# Patient Record
Sex: Female | Born: 1990 | Race: Black or African American | Hispanic: No | Marital: Single | Smoking: Current every day smoker
Health system: Southern US, Community
[De-identification: ages and names within clinical notes are randomized; demographics above are authoritative.]

## PROBLEM LIST (undated history)

## (undated) ENCOUNTER — Inpatient Hospital Stay (HOSPITAL_COMMUNITY): Payer: Self-pay

## (undated) ENCOUNTER — Emergency Department (HOSPITAL_COMMUNITY): Admission: EM | Payer: Medicaid Other | Source: Home / Self Care

## (undated) DIAGNOSIS — Z9289 Personal history of other medical treatment: Secondary | ICD-10-CM

## (undated) DIAGNOSIS — T148XXA Other injury of unspecified body region, initial encounter: Secondary | ICD-10-CM

## (undated) DIAGNOSIS — A549 Gonococcal infection, unspecified: Secondary | ICD-10-CM

## (undated) DIAGNOSIS — O1410 Severe pre-eclampsia, unspecified trimester: Secondary | ICD-10-CM

## (undated) DIAGNOSIS — B999 Unspecified infectious disease: Secondary | ICD-10-CM

## (undated) DIAGNOSIS — Z789 Other specified health status: Secondary | ICD-10-CM

## (undated) DIAGNOSIS — A749 Chlamydial infection, unspecified: Secondary | ICD-10-CM

## (undated) HISTORY — PX: NO PAST SURGERIES: SHX2092

## (undated) HISTORY — DX: Unspecified infectious disease: B99.9

---
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
## 2012-01-12 ENCOUNTER — Ambulatory Visit (INDEPENDENT_AMBULATORY_CARE_PROVIDER_SITE_OTHER): Payer: Medicaid Other

## 2012-01-12 DIAGNOSIS — Z36 Encounter for antenatal screening of mother: Secondary | ICD-10-CM

## 2012-01-12 LAB — US OB COMP LESS 14 WKS

## 2012-01-25 ENCOUNTER — Encounter: Payer: Medicaid Other | Admitting: Obstetrics and Gynecology

## 2012-01-25 ENCOUNTER — Telehealth: Payer: Self-pay | Admitting: Obstetrics and Gynecology

## 2012-01-25 NOTE — Telephone Encounter (Signed)
Tc to pt regarding msg.  Pt informed results typically take about 2 wks to come back.  We do not have them all yet.  Pt will discuss @ her appt on 01/30/12, pt voices agreement.

## 2012-01-30 ENCOUNTER — Ambulatory Visit (INDEPENDENT_AMBULATORY_CARE_PROVIDER_SITE_OTHER): Payer: Medicaid Other | Admitting: Obstetrics and Gynecology

## 2012-01-30 VITALS — BP 100/60 | Wt 121.0 lb

## 2012-01-30 DIAGNOSIS — Z331 Pregnant state, incidental: Secondary | ICD-10-CM

## 2012-01-30 DIAGNOSIS — Z3490 Encounter for supervision of normal pregnancy, unspecified, unspecified trimester: Secondary | ICD-10-CM

## 2012-01-30 MED ORDER — PREQUE 10 15-25-0.5-50 MG PO TABS: 2.0000 | ORAL_TABLET | Freq: Once | ORAL | Status: DC

## 2012-01-30 NOTE — Progress Notes (Signed)
[redacted]w[redacted]d AFP today 1st trim scree neg and PNL reviewed Repeat UCx Anatomy scan at [facility] Refill PNV  RTO 4wks

## 2012-02-01 LAB — ALPHA FETOPROTEIN, MATERNAL
AFP: 47.7 IU/mL
MoM for AFP: 1.32
Open Spina bifida: NEGATIVE

## 2012-02-01 LAB — CULTURE, OB URINE: Organism ID, Bacteria: NO GROWTH

## 2012-02-19 ENCOUNTER — Telehealth: Payer: Self-pay | Admitting: Obstetrics and Gynecology

## 2012-02-26 ENCOUNTER — Other Ambulatory Visit: Payer: Self-pay

## 2012-02-26 DIAGNOSIS — Z3689 Encounter for other specified antenatal screening: Secondary | ICD-10-CM

## 2012-02-27 ENCOUNTER — Ambulatory Visit (INDEPENDENT_AMBULATORY_CARE_PROVIDER_SITE_OTHER): Payer: Medicaid Other | Admitting: Obstetrics and Gynecology

## 2012-02-27 ENCOUNTER — Encounter: Payer: Self-pay | Admitting: Obstetrics and Gynecology

## 2012-02-27 ENCOUNTER — Ambulatory Visit (INDEPENDENT_AMBULATORY_CARE_PROVIDER_SITE_OTHER): Payer: Medicaid Other

## 2012-02-27 VITALS — BP 100/60 | Wt 121.0 lb

## 2012-02-27 DIAGNOSIS — IMO0001 Reserved for inherently not codable concepts without codable children: Secondary | ICD-10-CM

## 2012-02-27 DIAGNOSIS — R636 Underweight: Secondary | ICD-10-CM

## 2012-02-27 DIAGNOSIS — Z3689 Encounter for other specified antenatal screening: Secondary | ICD-10-CM

## 2012-02-27 DIAGNOSIS — A64 Unspecified sexually transmitted disease: Secondary | ICD-10-CM

## 2012-02-27 DIAGNOSIS — O358XX0 Maternal care for other (suspected) fetal abnormality and damage, not applicable or unspecified: Secondary | ICD-10-CM

## 2012-02-27 NOTE — Progress Notes (Signed)
[redacted]w[redacted]d Pt c/o some lower abdominal pain  Ultrasound shows:  Gestational age by [place] 19w 6d     SIUP  S=D     [place] EDD: 07/17/12             AF: nl            Cervical length: 3.27 cm            Placenta localization: posterior           Fetal presentation: variable      Anatomy survey is normal    Anatomy complete:  no            Gender : female Comments:Placenta  Edge to cx = 5.1 cm Normal placental cord insertion.  NOTE: 2 vessel cord. Left umbilical artery is missing. No other abnormality is seen. Anatomy not seen due to fetal position. LVOT, and lower fetal spine.normal head anatomy. CX closed. Normal ovaries and adnexa. Suggest f/u at 26/27 weeks to assess growth and anatomy.  No wt gain last visit .  Recommended at least 3 high calorie, high protein meals plus snacks Has quit smoking.  Still drinking lots of soda, advised to substitute milk and water. Next U/S at 26 wks

## 2012-02-28 LAB — US OB COMP + 14 WK

## 2012-03-20 NOTE — L&D Delivery Note (Signed)
Delivery Note  Pt began pushing at about 2230 , w frequent position changes due to pt having suprapubic pain. FHR remained reassuring, w mild variables.   At 11:33 PM a viable female was delivered via Vaginal, Spontaneous Delivery (Presentation: Right Occiput Posterior).  Shoulders delivered easily APGAR: 9, 9; weight: pending  Placenta status: Intact, Spontaneous. - to pathology  Cord: 2 vessels with the following complications: none Cord pH: n/a   Anesthesia: Epidural, local  Episiotomy: None Lacerations: 2nd degree Suture Repair: 3.0 vicryl rapide Est. Blood Loss (mL): 300  Mild persistent uterine atony, cytotec given PR w good results   Mom to postpartum.  Baby to nursery-stable. Infant remains skin-skin w mom, stable in recovery  Pt plans to BF, desires outpatient circumcision   24 hour urine collection to continue until 2pm on 07/21/12   Hannah Nelson M 07/21/2012, 12:22 AM

## 2012-03-26 ENCOUNTER — Encounter: Payer: Medicaid Other | Admitting: Obstetrics and Gynecology

## 2012-03-29 ENCOUNTER — Encounter: Payer: Self-pay | Admitting: Obstetrics and Gynecology

## 2012-03-29 ENCOUNTER — Ambulatory Visit (INDEPENDENT_AMBULATORY_CARE_PROVIDER_SITE_OTHER): Payer: Medicaid Other | Admitting: Obstetrics and Gynecology

## 2012-03-29 DIAGNOSIS — Z23 Encounter for immunization: Secondary | ICD-10-CM

## 2012-03-29 NOTE — Progress Notes (Signed)
[redacted]w[redacted]d Pt c/o HAs usuallly relieved with aleve.  Told her she can take that until 28 weeks 2VC  [place] for growth @[facility] 

## 2012-03-29 NOTE — Progress Notes (Signed)
Flu shot given today

## 2012-03-29 NOTE — Patient Instructions (Signed)
Fetal Movement Counts Patient Name: __________________________________________________ Patient Due Date: ____________________ Kick counts is highly recommended in high risk pregnancies, but it is a good idea for every pregnant woman to do. Start counting fetal movements at 28 weeks of the pregnancy. Fetal movements increase after eating a full meal or eating or drinking something sweet (the blood sugar is higher). It is also important to drink plenty of fluids (well hydrated) before doing the count. Lie on your left side because it helps with the circulation or you can sit in a comfortable chair with your arms over your belly (abdomen) with no distractions around you. DOING THE COUNT  Try to do the count the same time of day each time you do it.  Mark the day and time, then see how long it takes for you to feel 10 movements (kicks, flutters, swishes, rolls). You should have at least 10 movements within 2 hours. You will most likely feel 10 movements in much less than 2 hours. If you do not, wait an hour and count again. After a couple of days you will see a pattern.  What you are looking for is a change in the pattern or not enough counts in 2 hours. Is it taking longer in time to reach 10 movements? SEEK MEDICAL CARE IF:  You feel less than 10 counts in 2 hours. Tried twice.  No movement in one hour.  The pattern is changing or taking longer each day to reach 10 counts in 2 hours.  You feel the baby is not moving as it usually does. Date: ____________ Movements: ____________ Start time: ____________ Finish time: ____________  Date: ____________ Movements: ____________ Start time: ____________ Finish time: ____________ Date: ____________ Movements: ____________ Start time: ____________ Finish time: ____________ Date: ____________ Movements: ____________ Start time: ____________ Finish time: ____________ Date: ____________ Movements: ____________ Start time: ____________ Finish time:  ____________ Date: ____________ Movements: ____________ Start time: ____________ Finish time: ____________ Date: ____________ Movements: ____________ Start time: ____________ Finish time: ____________ Date: ____________ Movements: ____________ Start time: ____________ Finish time: ____________  Date: ____________ Movements: ____________ Start time: ____________ Finish time: ____________ Date: ____________ Movements: ____________ Start time: ____________ Finish time: ____________ Date: ____________ Movements: ____________ Start time: ____________ Finish time: ____________ Date: ____________ Movements: ____________ Start time: ____________ Finish time: ____________ Date: ____________ Movements: ____________ Start time: ____________ Finish time: ____________ Date: ____________ Movements: ____________ Start time: ____________ Finish time: ____________ Date: ____________ Movements: ____________ Start time: ____________ Finish time: ____________  Date: ____________ Movements: ____________ Start time: ____________ Finish time: ____________ Date: ____________ Movements: ____________ Start time: ____________ Finish time: ____________ Date: ____________ Movements: ____________ Start time: ____________ Finish time: ____________ Date: ____________ Movements: ____________ Start time: ____________ Finish time: ____________ Date: ____________ Movements: ____________ Start time: ____________ Finish time: ____________ Date: ____________ Movements: ____________ Start time: ____________ Finish time: ____________ Date: ____________ Movements: ____________ Start time: ____________ Finish time: ____________  Date: ____________ Movements: ____________ Start time: ____________ Finish time: ____________ Date: ____________ Movements: ____________ Start time: ____________ Finish time: ____________ Date: ____________ Movements: ____________ Start time: ____________ Finish time: ____________ Date: ____________ Movements:  ____________ Start time: ____________ Finish time: ____________ Date: ____________ Movements: ____________ Start time: ____________ Finish time: ____________ Date: ____________ Movements: ____________ Start time: ____________ Finish time: ____________ Date: ____________ Movements: ____________ Start time: ____________ Finish time: ____________  Date: ____________ Movements: ____________ Start time: ____________ Finish time: ____________ Date: ____________ Movements: ____________ Start time: ____________ Finish time: ____________ Date: ____________ Movements: ____________ Start time: ____________ Finish time: ____________ Date: ____________ Movements:   ____________ Start time: ____________ Finish time: ____________ Date: ____________ Movements: ____________ Start time: ____________ Finish time: ____________ Date: ____________ Movements: ____________ Start time: ____________ Finish time: ____________ Date: ____________ Movements: ____________ Start time: ____________ Finish time: ____________  Date: ____________ Movements: ____________ Start time: ____________ Finish time: ____________ Date: ____________ Movements: ____________ Start time: ____________ Finish time: ____________ Date: ____________ Movements: ____________ Start time: ____________ Finish time: ____________ Date: ____________ Movements: ____________ Start time: ____________ Finish time: ____________ Date: ____________ Movements: ____________ Start time: ____________ Finish time: ____________ Date: ____________ Movements: ____________ Start time: ____________ Finish time: ____________ Date: ____________ Movements: ____________ Start time: ____________ Finish time: ____________  Date: ____________ Movements: ____________ Start time: ____________ Finish time: ____________ Date: ____________ Movements: ____________ Start time: ____________ Finish time: ____________ Date: ____________ Movements: ____________ Start time: ____________ Finish  time: ____________ Date: ____________ Movements: ____________ Start time: ____________ Finish time: ____________ Date: ____________ Movements: ____________ Start time: ____________ Finish time: ____________ Date: ____________ Movements: ____________ Start time: ____________ Finish time: ____________ Date: ____________ Movements: ____________ Start time: ____________ Finish time: ____________  Date: ____________ Movements: ____________ Start time: ____________ Finish time: ____________ Date: ____________ Movements: ____________ Start time: ____________ Finish time: ____________ Date: ____________ Movements: ____________ Start time: ____________ Finish time: ____________ Date: ____________ Movements: ____________ Start time: ____________ Finish time: ____________ Date: ____________ Movements: ____________ Start time: ____________ Finish time: ____________ Date: ____________ Movements: ____________ Start time: ____________ Finish time: ____________ Document Released: 04/05/2006 Document Revised: 05/29/2011 Document Reviewed: 10/06/2008 [employer] Patient Information 2013 [employer].  

## 2012-04-26 ENCOUNTER — Other Ambulatory Visit: Payer: Self-pay | Admitting: Obstetrics and Gynecology

## 2012-04-26 ENCOUNTER — Other Ambulatory Visit: Payer: Medicaid Other

## 2012-04-26 ENCOUNTER — Ambulatory Visit: Payer: Medicaid Other

## 2012-04-26 ENCOUNTER — Ambulatory Visit: Payer: Medicaid Other | Admitting: Obstetrics and Gynecology

## 2012-04-26 ENCOUNTER — Encounter: Payer: Self-pay | Admitting: Obstetrics and Gynecology

## 2012-04-26 VITALS — BP 100/66 | Wt 146.0 lb

## 2012-04-26 DIAGNOSIS — Z331 Pregnant state, incidental: Secondary | ICD-10-CM

## 2012-04-26 LAB — US OB FOLLOW UP

## 2012-04-26 LAB — HEMOGLOBIN: Hemoglobin: 11.5 g/dL — ABNORMAL LOW (ref 12.0–15.0)

## 2012-04-26 NOTE — Progress Notes (Signed)
[redacted]w[redacted]d [place] EFW 2-9 46% vtx  AFI 14.1 cx 3.71 cm glucola today Pt with constant back pain.  Exercises reviewed

## 2012-04-26 NOTE — Patient Instructions (Signed)

## 2012-04-26 NOTE — Progress Notes (Signed)
Glucola given today. 

## 2012-05-09 ENCOUNTER — Encounter: Payer: Self-pay | Admitting: Obstetrics and Gynecology

## 2012-05-09 ENCOUNTER — Ambulatory Visit: Payer: Medicaid Other | Admitting: Obstetrics and Gynecology

## 2012-05-09 VITALS — BP 110/60 | Wt 149.0 lb

## 2012-05-09 DIAGNOSIS — Q270 Congenital absence and hypoplasia of umbilical artery: Secondary | ICD-10-CM

## 2012-05-09 NOTE — Progress Notes (Signed)
[redacted]w[redacted]d  GFM Glucola normal Ultrasound at [facility]

## 2012-05-09 NOTE — Progress Notes (Signed)
[redacted]w[redacted]d Pt has no complaints

## 2012-05-22 ENCOUNTER — Ambulatory Visit: Payer: Medicaid Other

## 2012-05-22 ENCOUNTER — Other Ambulatory Visit: Payer: Self-pay | Admitting: Obstetrics and Gynecology

## 2012-05-22 ENCOUNTER — Ambulatory Visit: Payer: Medicaid Other | Admitting: Family Medicine

## 2012-05-22 VITALS — BP 104/64 | Wt 149.0 lb

## 2012-05-22 DIAGNOSIS — Q270 Congenital absence and hypoplasia of umbilical artery: Secondary | ICD-10-CM

## 2012-05-22 DIAGNOSIS — Z331 Pregnant state, incidental: Secondary | ICD-10-CM

## 2012-05-22 LAB — US OB FOLLOW UP

## 2012-05-22 LAB — US OB TRANSVAGINAL

## 2012-05-22 NOTE — Progress Notes (Signed)
[redacted]w[redacted]d No complaints. Ultrasound shows:  SIUP  S=D     [place] EDD: 07/17/2012           AFI: 16.75 60th%tile           Cervical length:Cervix is closed            Placenta localization: posterior           Fetal presentation: Vertex           Note:2VC cord is seen, Normal [place]

## 2012-05-22 NOTE — Progress Notes (Signed)
[redacted]w[redacted]d Doing well, good FM. U/S reviewed, no questions. ROB in 2 wks. L.Carter, FNP-BC

## 2012-06-06 ENCOUNTER — Ambulatory Visit: Payer: Medicaid Other | Admitting: Obstetrics and Gynecology

## 2012-06-06 ENCOUNTER — Encounter: Payer: Self-pay | Admitting: Obstetrics and Gynecology

## 2012-06-06 NOTE — Progress Notes (Signed)
Pt c/o back pain

## 2012-06-06 NOTE — Patient Instructions (Signed)

## 2012-06-06 NOTE — Progress Notes (Signed)
[redacted]w[redacted]d A/P GBS @[facility]  Fetal kick counts reviewed Labor reviewed with pt All patients  questions answered [place] for growth bc of 2vc

## 2012-07-20 ENCOUNTER — Encounter (HOSPITAL_COMMUNITY): Payer: Self-pay | Admitting: Family

## 2012-07-20 ENCOUNTER — Inpatient Hospital Stay (HOSPITAL_COMMUNITY)
Admission: AD | Admit: 2012-07-20 | Discharge: 2012-07-24 | DRG: 774 | Disposition: A | Discharge status: Home / Self Care | Payer: Medicaid Other | Source: Ambulatory Visit | Attending: Obstetrics and Gynecology | Admitting: Obstetrics and Gynecology

## 2012-07-20 ENCOUNTER — Inpatient Hospital Stay (HOSPITAL_COMMUNITY): Payer: Medicaid Other | Admitting: Anesthesiology

## 2012-07-20 ENCOUNTER — Encounter (HOSPITAL_COMMUNITY): Payer: Self-pay | Admitting: Anesthesiology

## 2012-07-20 DIAGNOSIS — D689 Coagulation defect, unspecified: Secondary | ICD-10-CM | POA: Diagnosis not present

## 2012-07-20 DIAGNOSIS — A64 Unspecified sexually transmitted disease: Secondary | ICD-10-CM

## 2012-07-20 DIAGNOSIS — N898 Other specified noninflammatory disorders of vagina: Secondary | ICD-10-CM | POA: Diagnosis not present

## 2012-07-20 DIAGNOSIS — O1490 Unspecified pre-eclampsia, unspecified trimester: Secondary | ICD-10-CM | POA: Diagnosis present

## 2012-07-20 DIAGNOSIS — O139 Gestational [pregnancy-induced] hypertension without significant proteinuria, unspecified trimester: Secondary | ICD-10-CM | POA: Diagnosis present

## 2012-07-20 DIAGNOSIS — O358XX0 Maternal care for other (suspected) fetal abnormality and damage, not applicable or unspecified: Secondary | ICD-10-CM | POA: Diagnosis present

## 2012-07-20 DIAGNOSIS — D6489 Other specified anemias: Secondary | ICD-10-CM | POA: Diagnosis not present

## 2012-07-20 DIAGNOSIS — R636 Underweight: Secondary | ICD-10-CM

## 2012-07-20 DIAGNOSIS — O9903 Anemia complicating the puerperium: Secondary | ICD-10-CM | POA: Diagnosis not present

## 2012-07-20 DIAGNOSIS — D696 Thrombocytopenia, unspecified: Secondary | ICD-10-CM | POA: Diagnosis not present

## 2012-07-20 LAB — COMPREHENSIVE METABOLIC PANEL
ALT: 11 U/L (ref 0–35)
CO2: 21 mEq/L (ref 19–32)
Calcium: 9 mg/dL (ref 8.4–10.5)
GFR calc Af Amer: >90 mL/min (ref >90)
GFR calc non Af Amer: 81 mL/min — ABNORMAL LOW (ref >90)
Glucose, Bld: 76 mg/dL (ref 70–99)
Sodium: 136 mEq/L (ref 135–145)
Total Bilirubin: 0.3 mg/dL (ref 0.3–1.2)

## 2012-07-20 LAB — CBC
Hemoglobin: 13.5 g/dL (ref 12.0–15.0)
MCH: 29.7 pg (ref 26.0–34.0)
MCV: 85.5 fL (ref 78.0–100.0)
RBC: 4.54 MIL/uL (ref 3.87–5.11)

## 2012-07-20 LAB — URINALYSIS, ROUTINE W REFLEX MICROSCOPIC
Nitrite: NEGATIVE
Specific Gravity, Urine: 1.02 (ref 1.005–1.030)
pH: 6 (ref 5.0–8.0)

## 2012-07-20 LAB — RAPID URINE DRUG SCREEN, HOSP PERFORMED
Cocaine: NOT DETECTED
Opiates: NOT DETECTED

## 2012-07-20 LAB — URINE MICROSCOPIC-ADD ON

## 2012-07-20 MED ORDER — LACTATED RINGERS IV SOLN
INTRAVENOUS | Status: DC
  Administered 2012-07-20 (×3): via INTRAVENOUS

## 2012-07-20 MED ORDER — EPHEDRINE 5 MG/ML INJ
10.0000 mg | INTRAVENOUS | Status: DC | PRN
  Filled 2012-07-20: qty 2

## 2012-07-20 MED ORDER — LABETALOL HCL 5 MG/ML IV SOLN
10.0000 mg | INTRAVENOUS | Status: DC | PRN
  Filled 2012-07-20: qty 4

## 2012-07-20 MED ORDER — OXYTOCIN 40 UNITS IN LACTATED RINGERS INFUSION - SIMPLE MED
1.0000 m[IU]/min | INTRAVENOUS | Status: DC
  Administered 2012-07-20: 1 m[IU]/min via INTRAVENOUS
  Administered 2012-07-20: 4 m[IU]/min via INTRAVENOUS
  Filled 2012-07-20: qty 1000

## 2012-07-20 MED ORDER — CITRIC ACID-SODIUM CITRATE 334-500 MG/5ML PO SOLN: 30.0000 mL | ORAL | Status: DC | PRN

## 2012-07-20 MED ORDER — OXYTOCIN 40 UNITS IN LACTATED RINGERS INFUSION - SIMPLE MED: 62.5000 mL/h | INTRAVENOUS | Status: DC

## 2012-07-20 MED ORDER — OXYTOCIN BOLUS FROM INFUSION
500.0000 mL | INTRAVENOUS | Status: DC
  Administered 2012-07-20: 500 mL via INTRAVENOUS

## 2012-07-20 MED ORDER — OXYCODONE-ACETAMINOPHEN 5-325 MG PO TABS: 1.0000 | ORAL_TABLET | ORAL | Status: DC | PRN

## 2012-07-20 MED ORDER — SODIUM BICARBONATE 8.4 % IV SOLN
INTRAVENOUS | Status: DC | PRN
  Administered 2012-07-20: 5 mL via EPIDURAL

## 2012-07-20 MED ORDER — LIDOCAINE-EPINEPHRINE (PF) 2 %-1:200000 IJ SOLN
INTRAMUSCULAR | Status: AC
  Filled 2012-07-20: qty 20

## 2012-07-20 MED ORDER — LACTATED RINGERS IV SOLN: 500.0000 mL | INTRAVENOUS | Status: DC | PRN

## 2012-07-20 MED ORDER — LACTATED RINGERS IV SOLN
500.0000 mL | Freq: Once | INTRAVENOUS | Status: AC
  Administered 2012-07-20: 16:00:00 via INTRAVENOUS

## 2012-07-20 MED ORDER — LIDOCAINE HCL (PF) 2 % IJ SOLN
INTRAMUSCULAR | Status: DC | PRN
  Administered 2012-07-20 (×2): 5 mg

## 2012-07-20 MED ORDER — ACETAMINOPHEN 325 MG PO TABS: 650.0000 mg | ORAL_TABLET | ORAL | Status: DC | PRN

## 2012-07-20 MED ORDER — PHENYLEPHRINE 40 MCG/ML (10ML) SYRINGE FOR IV PUSH (FOR BLOOD PRESSURE SUPPORT)
80.0000 ug | PREFILLED_SYRINGE | INTRAVENOUS | Status: DC | PRN
  Filled 2012-07-20: qty 2

## 2012-07-20 MED ORDER — ZOLPIDEM TARTRATE 5 MG PO TABS: 5.0000 mg | ORAL_TABLET | Freq: Every evening | ORAL | Status: DC | PRN

## 2012-07-20 MED ORDER — BUTORPHANOL TARTRATE 1 MG/ML IJ SOLN: 1.0000 mg | INTRAMUSCULAR | Status: DC | PRN

## 2012-07-20 MED ORDER — MISOPROSTOL 200 MCG PO TABS
ORAL_TABLET | ORAL | Status: AC
  Administered 2012-07-20: 1000 ug
  Filled 2012-07-20: qty 5

## 2012-07-20 MED ORDER — SODIUM BICARBONATE 8.4 % IV SOLN
INTRAVENOUS | Status: AC
  Filled 2012-07-20: qty 50

## 2012-07-20 MED ORDER — FLEET ENEMA 7-19 GM/118ML RE ENEM: 1.0000 | ENEMA | RECTAL | Status: DC | PRN

## 2012-07-20 MED ORDER — FENTANYL 2.5 MCG/ML BUPIVACAINE 1/10 % EPIDURAL INFUSION (WH - ANES)
14.0000 mL/h | INTRAMUSCULAR | Status: DC | PRN
  Administered 2012-07-20: 14 mL/h via EPIDURAL
  Administered 2012-07-20: 17 mL/h via EPIDURAL
  Filled 2012-07-20 (×2): qty 125

## 2012-07-20 MED ORDER — FENTANYL 2.5 MCG/ML BUPIVACAINE 1/10 % EPIDURAL INFUSION (WH - ANES)
INTRAMUSCULAR | Status: DC | PRN
  Administered 2012-07-20: 14 mL/h via EPIDURAL

## 2012-07-20 MED ORDER — IBUPROFEN 600 MG PO TABS: 600.0000 mg | ORAL_TABLET | Freq: Four times a day (QID) | ORAL | Status: DC | PRN

## 2012-07-20 MED ORDER — PHENYLEPHRINE 40 MCG/ML (10ML) SYRINGE FOR IV PUSH (FOR BLOOD PRESSURE SUPPORT)
80.0000 ug | PREFILLED_SYRINGE | INTRAVENOUS | Status: DC | PRN
  Filled 2012-07-20: qty 2
  Filled 2012-07-20 (×2): qty 5

## 2012-07-20 MED ORDER — EPHEDRINE 5 MG/ML INJ
10.0000 mg | INTRAVENOUS | Status: DC | PRN
  Filled 2012-07-20: qty 2
  Filled 2012-07-20 (×2): qty 4

## 2012-07-20 MED ORDER — LIDOCAINE HCL (PF) 1 % IJ SOLN
30.0000 mL | INTRAMUSCULAR | Status: AC | PRN
  Administered 2012-07-20: 30 mL via SUBCUTANEOUS
  Filled 2012-07-20 (×2): qty 30

## 2012-07-20 MED ORDER — LIDOCAINE HCL (PF) 1 % IJ SOLN
INTRAMUSCULAR | Status: DC | PRN
  Administered 2012-07-20 (×2): 8 mL

## 2012-07-20 MED ORDER — DIPHENHYDRAMINE HCL 50 MG/ML IJ SOLN: 12.5000 mg | INTRAMUSCULAR | Status: DC | PRN

## 2012-07-20 MED ORDER — ONDANSETRON HCL 4 MG/2ML IJ SOLN
4.0000 mg | Freq: Four times a day (QID) | INTRAMUSCULAR | Status: DC | PRN
  Administered 2012-07-20: 4 mg via INTRAVENOUS
  Filled 2012-07-20: qty 2

## 2012-07-20 NOTE — MAU Note (Signed)
Patient presents to MAU with c/o contractions every 5 minutes for last hour; reports + fetal movement. Questionable ROM. Denies frank vaginal bleeding, only bloody show.  Reports baby has 2 vessel cord.

## 2012-07-20 NOTE — Progress Notes (Signed)
Patient ID: Hannah Nelson, female   DOB: Oct 23, 1990, 22 y.o.   MRN: [id number] Hannah Nelson is a 22 y.o. G1P0 at 103w3d admitted in early labor  Subjective: Epidural working a little better, starts to wear off on non-dependent side, has been rebolused and using PCA, pt declines further intervention right now. C/o nausea, no HA, or RUQ pain   Objective: BP 147/92  Pulse 70  Temp(Src) 98 F (36.7 C) (Oral)  Resp 20  Ht 5\' 4"  (1.626 m)  Wt 163 lb (73.936 kg)  BMI 27.97 kg/m2  SpO2 100%  LMP 10/11/2011  Filed Vitals:   07/20/12 1837 07/20/12 1902 07/20/12 1905 07/20/12 1927  BP: 147/88 147/90 147/92   Pulse: 93 83 70   Temp:  98 F (36.7 C)    TempSrc:      Resp: 20 18  20   Height:      Weight:      SpO2:           FHT:  FHR: 130 bpm, variability: moderate,  accelerations:  Present,  decelerations:  Absent, periods of decreased variability, +scalp stim  UC:   regular, every 2-4 minutes SVE:   6/100/-1 FSE replaced, previous one had not been tracing    Assessment / Plan: cervical change after AROM, MVU's inadequate   Labor: will begin pitocin aug Preeclampsia:  no signs or symptoms of toxicity 24hr urine in progress, labs normal, BP stable  Fetal Wellbeing:  Category I Pain Control:  Epidural Anticipated MOD:  NSVD  Recheck after 2hrs adequate ctx   Update physician PRN   Malissa Hippo 07/20/2012, 7:54 PM

## 2012-07-20 NOTE — Progress Notes (Signed)
Subjective: More comfortable with epidural, but still aware of contractions, particularly on right side.  RN has hit PCA twice.  Objective: BP 130/84  Pulse 76  Temp(Src) 98.3 F (36.8 C) (Oral)  Resp 20  Ht 5\' 4"  (1.626 m)  Wt 163 lb (73.936 kg)  BMI 27.97 kg/m2  SpO2 100%  LMP 10/11/2011  Filed Vitals:   07/20/12 1401 07/20/12 1402 07/20/12 1432 07/20/12 1502  BP: 150/93 139/86 147/92 130/84  Pulse: 77 84 79 76  Temp:   98.3 F (36.8 C)   TempSrc:   Oral   Resp: 20 20 18 20   Height:      Weight:      SpO2:       Results for orders placed during the hospital encounter of 07/20/12 (from the past 24 hour(s))  URINALYSIS, ROUTINE W REFLEX MICROSCOPIC     Status: Abnormal   Collection Time    07/20/12 10:43 AM      Result Value Range   Color, Urine YELLOW  YELLOW   APPearance CLEAR  CLEAR   Specific Gravity, Urine 1.020  1.005 - 1.030   pH 6.0  5.0 - 8.0   Glucose, UA NEGATIVE  NEGATIVE mg/dL   Hgb urine dipstick NEGATIVE  NEGATIVE   Bilirubin Urine NEGATIVE  NEGATIVE   Ketones, ur NEGATIVE  NEGATIVE mg/dL   Protein, ur NEGATIVE  NEGATIVE mg/dL   Urobilinogen, UA 0.2  0.0 - 1.0 mg/dL   Nitrite NEGATIVE  NEGATIVE   Leukocytes, UA TRACE (*) NEGATIVE  URINE RAPID DRUG SCREEN (HOSP PERFORMED)     Status: None   Collection Time    07/20/12 10:43 AM      Result Value Range   Opiates NONE DETECTED  NONE DETECTED   Cocaine NONE DETECTED  NONE DETECTED   Benzodiazepines NONE DETECTED  NONE DETECTED   Amphetamines NONE DETECTED  NONE DETECTED   Tetrahydrocannabinol NONE DETECTED  NONE DETECTED   Barbiturates NONE DETECTED  NONE DETECTED  URINE MICROSCOPIC-ADD ON     Status: Abnormal   Collection Time    07/20/12 10:43 AM      Result Value Range   Squamous Epithelial / LPF FEW (*) RARE   WBC, UA 3-6  <3 WBC/hpf   RBC / HPF 0-2  <3 RBC/hpf   Bacteria, UA FEW (*) RARE  CBC     Status: Abnormal   Collection Time    07/20/12 11:30 AM      Result Value Range   WBC  15.8 (*) 4.0 - 10.5 K/uL   RBC 4.54  3.87 - 5.11 MIL/uL   Hemoglobin 13.5  12.0 - 15.0 g/dL   HCT 40.9  81.1 - 91.4 %   MCV 85.5  78.0 - 100.0 fL   MCH 29.7  26.0 - 34.0 pg   MCHC 34.8  30.0 - 36.0 g/dL   RDW 78.2  95.6 - 21.3 %   Platelets 188  150 - 400 K/uL  COMPREHENSIVE METABOLIC PANEL     Status: Abnormal   Collection Time    07/20/12 11:30 AM      Result Value Range   Sodium 136  135 - 145 mEq/L   Potassium 3.7  3.5 - 5.1 mEq/L   Chloride 105  96 - 112 mEq/L   CO2 21  19 - 32 mEq/L   Glucose, Bld 76  70 - 99 mg/dL   BUN 9  6 - 23 mg/dL   Creatinine, Ser 0.86  0.50 - 1.10 mg/dL   Calcium 9.0  8.4 - 16.1 mg/dL   Total Protein 5.3 (*) 6.0 - 8.3 g/dL   Albumin 2.4 (*) 3.5 - 5.2 g/dL   AST 20  0 - 37 U/L   ALT 11  0 - 35 U/L   Alkaline Phosphatase 217 (*) 39 - 117 U/L   Total Bilirubin 0.3  0.3 - 1.2 mg/dL   GFR calc non Af Amer 81 (*) >90 mL/min   GFR calc Af Amer >90  >90 mL/min  LACTATE DEHYDROGENASE     Status: None   Collection Time    07/20/12 11:30 AM      Result Value Range   LDH 229  94 - 250 U/L  URIC ACID     Status: None   Collection Time    07/20/12 11:30 AM      Result Value Range   Uric Acid, Serum 6.4  2.4 - 7.0 mg/dL   24 hour urine collection begun with insertion of foley at 2pm.   Total I/O In: 700 [I.V.:700] Out: 150 [Urine:150]  FHT: Reassuring, with moderate variability since IV hydration--no decels, segments of spontaneous CST. UC:   irregular, every 3-7 minutes, mild/moderate quality  SVE:  2-3, 90%, vtx, -1 BBOW AROM--clear fluid IUPC and FSE applied.  Assessment / Plan: Early labor Gestational hypertension vs pre-eclampsia 2vc Improved FHR tracing  Plan: Observe labor status after AROM--augment prn. Continue to monitor BP--Labetalol prn for elevations.  Nigel Bridgeman 07/20/2012, 3:17 PM

## 2012-07-20 NOTE — Anesthesia Procedure Notes (Signed)
Epidural Patient location during procedure: OB Start time: 07/20/2012 1:18 PM End time: 07/20/2012 1:22 PM  Staffing Anesthesiologist: Sandrea Hughs Performed by: anesthesiologist   Preanesthetic Checklist Completed: patient identified, site marked, surgical consent, pre-op evaluation, timeout performed, IV checked, risks and benefits discussed and monitors and equipment checked  Epidural Patient position: sitting Prep: site prepped and draped and DuraPrep Patient monitoring: continuous pulse ox and blood pressure Approach: midline Injection technique: LOR air  Needle:  Needle type: Tuohy  Needle gauge: 17 G Needle length: 9 cm and 9 Needle insertion depth: 5 cm cm Catheter type: closed end flexible Catheter size: 19 Gauge Catheter at skin depth: 10 cm Test dose: negative and Other  Assessment Sensory level: T8 Events: blood not aspirated, injection not painful, no injection resistance, negative IV test and no paresthesia  Additional Notes Reason for block:procedure for pain

## 2012-07-20 NOTE — H&P (Signed)
Hannah Nelson is a 22 y.o. female, G1P0 at 70 3/7 weeks, presenting with gradually increasing contractions since last night, with patient noting contractions for 3 days.  Reports small amount of bloody mucus through the night.  Cervix was 2 cm in office this week.  Reports +FM.  Denies HA, visual sx, epigastric pain.  Patient Active Problem List   Diagnosis Date Noted  . Umbilical vein abnormality complicating pregnancy 02/27/2012  . Hx Sexually transmitted diseases (STD) 12/29/2011  . Low weight 12/29/2011    History of present pregnancy: Patient entered care at 11 2/7 weeks.   EDC of 07/18/12 was established by 6 2/7 week [place] in MAU.   Anatomy scan:  19 6/7 weeks, with normal anatomy, 2VC noted, and an posterior placenta.   Additional [place] evaluations:   28 2/7 weeks, for growth--WNL 32 weeks for growth--WNL 36 weeks for growth--EFW 41%ile, AFI 19, vtx.  Significant prenatal events:  Followed by [place] q 4 weeks for growth from 28 weeks.    Last evaluation:  07/17/12, cervix 2 cm, 70%.  OB History   Grav Para Term Preterm Abortions TAB SAB Ect Mult Living   1              Past Medical History  Diagnosis Date  . No pertinent past medical history   . Chlamydia June 2013  . Gonorrhea   . Infection     BV;may get frequently;currently has BV and meds rx'd  . Infection     UTI;not frequently   Past Surgical History  Procedure Laterality Date  . No past surgeries     Family History: family history includes Heart disease in her father.  Social History:  reports that she quit smoking about 7 months ago. She has never used smokeless tobacco. 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 She reports that she does not drink alcohol.  No marijuana use during pregnancy per patient. Employed at [employer].  Mother is present with her as support person.  FOB is identified as Firefighter.   Prenatal Transfer Tool  Maternal Diabetes: No Genetic Screening:  Normal Maternal Ultrasounds/Referrals: Abnormal:  Findings:   Other: 2VC, but normal growth and fluid during pregnancy Fetal Ultrasounds or other Referrals:  None Maternal Substance Abuse:  Yes:  Type: Marijuana prior to pregnancy per patient report Significant Maternal Medications:  None Significant Maternal Lab Results: Lab values include: Group B Strep negative    ROS:  Contractions, small amount bloody show, +FM  No Known Allergies   Dilation: 2 Effacement (%): 90 Station: -1 Exam by:: Emilee Hero, CNM   Blood pressure 155/107, pulse 80, temperature 98.9 F (37.2 C), temperature source Oral, resp. rate 18, height 5\' 4"  (1.626 m), weight 163 lb (73.936 kg), last menstrual period 10/11/2011.  Filed Vitals:   07/20/12 1054 07/20/12 1105  BP: 137/96 155/107  Pulse: 77 80  Temp: 98.9 F (37.2 C)   TempSrc: Oral   Resp: 18   Height: 5\' 4"  (1.626 m)   Weight: 163 lb (73.936 kg)      Chest clear Heart RRR without murmur Abd gravid, NT, FH 39 cm Pelvic: as above Ext: DTR 2+ without clonus, trace edema  FHR: Non-reactive, with minimal variability.  Small change in baseline with scalp stim, but no other accels noted.  No decels with contractions. UCs:  q2-4 min, mild.  Prenatal labs: ABO, Rh: B/POS/-- (10/02 9604) Antibody: NEG (10/02 0921) Rubella:   Immune RPR:  NON REAC (02/07 1031)  HBsAg: NEGATIVE (10/02 0921)  HIV: NON REACTIVE (10/02 0921)  GBS:  Negative Sickle cell/Hgb electrophoresis:  WNL Pap:  NA due to age GC:  Negative at NOB 12/2011 and 06/20/12 Chlamydia:  Negative at NOB 12/2011 and 06/20/12 Genetic screenings:  Normal 1st trimester screen and AFP Glucola:  WNL Hgb 13.8 at NOB, 11.5 at 28 weeks.   Assessment/Plan: IUP at 40 3/7 weeks Early labor 2VC  Elevated BP Category 2 FHR GBS negative Hx marijuana use in past  Plan: Admit to [facility] per consult with Dr. Pennie Rushing Routine [facility] orders IV hydration PIH labs UA and UDS on  CCUA Recommend epidural due to elevated BP and minimal variability of FHR Start 24 hour urine when foley placed Labetalol prn for BP parameters  AROM after epidural placed with expectation of internal monitoring. Close observation of FHR status and maternal BP.  Lindy Garczynski, [place], [place] 07/20/2012, 11:15 AM

## 2012-07-20 NOTE — Anesthesia Preprocedure Evaluation (Signed)
Anesthesia Evaluation  Patient identified by MRN, date of birth, ID band Patient awake    Reviewed: Allergy & Precautions, H&P , NPO status , Patient's Chart, lab work & pertinent test results  Airway Mallampati: I TM Distance: >3 FB Neck ROM: full    Dental no notable dental hx.    Pulmonary neg pulmonary ROS,    Pulmonary exam normal       Cardiovascular hypertension,     Neuro/Psych negative neurological ROS  negative psych ROS   GI/Hepatic negative GI ROS, Neg liver ROS,   Endo/Other  negative endocrine ROS  Renal/GU negative Renal ROS  negative genitourinary   Musculoskeletal negative musculoskeletal ROS (+)   Abdominal Normal abdominal exam  (+)   Peds negative pediatric ROS (+)  Hematology negative hematology ROS (+)   Anesthesia Other Findings   Reproductive/Obstetrics (+) Pregnancy                           Anesthesia Physical Anesthesia Plan  ASA: II  Anesthesia Plan: Epidural   Post-op Pain Management:    Induction:   Airway Management Planned:   Additional Equipment:   Intra-op Plan:   Post-operative Plan:   Informed Consent: I have reviewed the patients History and Physical, chart, labs and discussed the procedure including the risks, benefits and alternatives for the proposed anesthesia with the patient or authorized representative who has indicated his/her understanding and acceptance.     Plan Discussed with:   Anesthesia Plan Comments:         Anesthesia Quick Evaluation

## 2012-07-21 ENCOUNTER — Encounter (HOSPITAL_COMMUNITY): Payer: Self-pay | Admitting: *Deleted

## 2012-07-21 DIAGNOSIS — N898 Other specified noninflammatory disorders of vagina: Secondary | ICD-10-CM | POA: Diagnosis not present

## 2012-07-21 LAB — CBC
HCT: 32.2 % — ABNORMAL LOW (ref 36.0–46.0)
HCT: 26.3 % — ABNORMAL LOW (ref 36.0–46.0)
Hemoglobin: 11.3 g/dL — ABNORMAL LOW (ref 12.0–15.0)
MCH: 29.7 pg (ref 26.0–34.0)
MCH: 29.5 pg (ref 26.0–34.0)
MCH: 29.5 pg (ref 26.0–34.0)
MCHC: 35.1 g/dL (ref 30.0–36.0)
MCV: 84.5 fL (ref 78.0–100.0)
MCV: 85.4 fL (ref 78.0–100.0)
Platelets: 173 10*3/uL (ref 150–400)
Platelets: 129 10*3/uL — ABNORMAL LOW (ref 150–400)
RBC: 3.81 MIL/uL — ABNORMAL LOW (ref 3.87–5.11)
RBC: 3.08 MIL/uL — ABNORMAL LOW (ref 3.87–5.11)
RBC: 3.32 MIL/uL — ABNORMAL LOW (ref 3.87–5.11)
RDW: 14.2 % (ref 11.5–15.5)
WBC: 16.9 10*3/uL — ABNORMAL HIGH (ref 4.0–10.5)

## 2012-07-21 LAB — URINE CULTURE
Colony Count: NO GROWTH
Culture: NO GROWTH

## 2012-07-21 MED ORDER — TETANUS-DIPHTH-ACELL PERTUSSIS 5-2.5-18.5 LF-MCG/0.5 IM SUSP
0.5000 mL | Freq: Once | INTRAMUSCULAR | Status: AC
  Administered 2012-07-22: 0.5 mL via INTRAMUSCULAR
  Filled 2012-07-21: qty 0.5

## 2012-07-21 MED ORDER — DIPHENHYDRAMINE HCL 12.5 MG/5ML PO ELIX
12.5000 mg | ORAL_SOLUTION | Freq: Four times a day (QID) | ORAL | Status: DC | PRN
  Filled 2012-07-21: qty 5

## 2012-07-21 MED ORDER — PROMETHAZINE HCL 25 MG/ML IJ SOLN
25.0000 mg | Freq: Once | INTRAMUSCULAR | Status: AC
  Administered 2012-07-21: 12.5 mg via INTRAVENOUS

## 2012-07-21 MED ORDER — MEASLES, MUMPS & RUBELLA VAC ~~LOC~~ INJ
0.5000 mL | INJECTION | Freq: Once | SUBCUTANEOUS | Status: DC
  Filled 2012-07-21: qty 0.5

## 2012-07-21 MED ORDER — ONDANSETRON HCL 4 MG/2ML IJ SOLN: 4.0000 mg | INTRAMUSCULAR | Status: DC | PRN

## 2012-07-21 MED ORDER — OXYTOCIN 40 UNITS IN LACTATED RINGERS INFUSION - SIMPLE MED
INTRAVENOUS | Status: AC
  Filled 2012-07-21: qty 1000

## 2012-07-21 MED ORDER — WITCH HAZEL-GLYCERIN EX PADS: 1.0000 "application " | MEDICATED_PAD | CUTANEOUS | Status: DC | PRN

## 2012-07-21 MED ORDER — PRENATAL MULTIVITAMIN CH
1.0000 | ORAL_TABLET | Freq: Every day | ORAL | Status: DC
  Administered 2012-07-22 – 2012-07-23 (×2): 1 via ORAL
  Filled 2012-07-21: qty 1

## 2012-07-21 MED ORDER — ZOLPIDEM TARTRATE 5 MG PO TABS: 5.0000 mg | ORAL_TABLET | Freq: Every evening | ORAL | Status: DC | PRN

## 2012-07-21 MED ORDER — ONDANSETRON HCL 4 MG/2ML IJ SOLN: 4.0000 mg | Freq: Four times a day (QID) | INTRAMUSCULAR | Status: DC | PRN

## 2012-07-21 MED ORDER — OXYCODONE-ACETAMINOPHEN 5-325 MG PO TABS
1.0000 | ORAL_TABLET | ORAL | Status: DC | PRN
  Administered 2012-07-21 – 2012-07-24 (×6): 1 via ORAL
  Filled 2012-07-21 (×6): qty 1

## 2012-07-21 MED ORDER — OXYTOCIN 40 UNITS IN LACTATED RINGERS INFUSION - SIMPLE MED
INTRAVENOUS | Status: AC
  Administered 2012-07-21: 40 [IU]
  Filled 2012-07-21: qty 1000

## 2012-07-21 MED ORDER — OXYTOCIN 40 UNITS IN LACTATED RINGERS INFUSION - SIMPLE MED: 250.0000 mL/h | Freq: Once | INTRAVENOUS | Status: DC

## 2012-07-21 MED ORDER — IBUPROFEN 600 MG PO TABS
600.0000 mg | ORAL_TABLET | Freq: Four times a day (QID) | ORAL | Status: DC
  Administered 2012-07-21 – 2012-07-24 (×11): 600 mg via ORAL
  Filled 2012-07-21 (×11): qty 1

## 2012-07-21 MED ORDER — MEDROXYPROGESTERONE ACETATE 150 MG/ML IM SUSP: 150.0000 mg | INTRAMUSCULAR | Status: DC | PRN

## 2012-07-21 MED ORDER — DIPHENHYDRAMINE HCL 25 MG PO CAPS: 25.0000 mg | ORAL_CAPSULE | Freq: Four times a day (QID) | ORAL | Status: DC | PRN

## 2012-07-21 MED ORDER — FENTANYL CITRATE 0.05 MG/ML IJ SOLN
INTRAMUSCULAR | Status: AC
  Administered 2012-07-21: 05:00:00
  Filled 2012-07-21: qty 2

## 2012-07-21 MED ORDER — SENNOSIDES-DOCUSATE SODIUM 8.6-50 MG PO TABS
2.0000 | ORAL_TABLET | Freq: Every day | ORAL | Status: DC
  Administered 2012-07-21 – 2012-07-23 (×3): 2 via ORAL

## 2012-07-21 MED ORDER — SIMETHICONE 80 MG PO CHEW: 80.0000 mg | CHEWABLE_TABLET | ORAL | Status: DC | PRN

## 2012-07-21 MED ORDER — LACTATED RINGERS IV SOLN
INTRAVENOUS | Status: DC
  Administered 2012-07-21 – 2012-07-22 (×3): via INTRAVENOUS

## 2012-07-21 MED ORDER — BENZOCAINE-MENTHOL 20-0.5 % EX AERO
1.0000 "application " | INHALATION_SPRAY | CUTANEOUS | Status: DC | PRN
  Administered 2012-07-21: 1 via TOPICAL
  Filled 2012-07-21: qty 56

## 2012-07-21 MED ORDER — MAGNESIUM SULFATE 40 G IN LACTATED RINGERS - SIMPLE
2.0000 g/h | INTRAVENOUS | Status: AC
  Administered 2012-07-21: 2 g/h via INTRAVENOUS
  Filled 2012-07-21: qty 500

## 2012-07-21 MED ORDER — DIBUCAINE 1 % RE OINT: 1.0000 "application " | TOPICAL_OINTMENT | RECTAL | Status: DC | PRN

## 2012-07-21 MED ORDER — LANOLIN HYDROUS EX OINT: TOPICAL_OINTMENT | CUTANEOUS | Status: DC | PRN

## 2012-07-21 MED ORDER — NALOXONE HCL 0.4 MG/ML IJ SOLN: 0.4000 mg | INTRAMUSCULAR | Status: DC | PRN

## 2012-07-21 MED ORDER — FLEET ENEMA 7-19 GM/118ML RE ENEM: 1.0000 | ENEMA | Freq: Every day | RECTAL | Status: DC | PRN

## 2012-07-21 MED ORDER — MAGNESIUM SULFATE BOLUS VIA INFUSION
4.0000 g | Freq: Once | INTRAVENOUS | Status: AC
  Administered 2012-07-21: 4 g via INTRAVENOUS
  Filled 2012-07-21: qty 500

## 2012-07-21 MED ORDER — HYDROMORPHONE 0.3 MG/ML IV SOLN
INTRAVENOUS | Status: DC
  Administered 2012-07-21: 06:00:00 via INTRAVENOUS
  Administered 2012-07-21: 0.6 mg via INTRAVENOUS
  Administered 2012-07-21: 3.3 mg via INTRAVENOUS
  Administered 2012-07-21: 0.6 mg via INTRAVENOUS
  Administered 2012-07-22 (×2): 0.3 mg via INTRAVENOUS
  Filled 2012-07-21: qty 25

## 2012-07-21 MED ORDER — ACETAMINOPHEN 650 MG RE SUPP
650.0000 mg | Freq: Once | RECTAL | Status: AC
  Administered 2012-07-21: 650 mg via RECTAL
  Filled 2012-07-21: qty 1

## 2012-07-21 MED ORDER — SODIUM CHLORIDE 0.9 % IJ SOLN: 9.0000 mL | INTRAMUSCULAR | Status: DC | PRN

## 2012-07-21 MED ORDER — BISACODYL 10 MG RE SUPP: 10.0000 mg | Freq: Every day | RECTAL | Status: DC | PRN

## 2012-07-21 MED ORDER — DIPHENHYDRAMINE HCL 50 MG/ML IJ SOLN: 25.0000 mg | Freq: Once | INTRAMUSCULAR | Status: DC

## 2012-07-21 MED ORDER — DIPHENHYDRAMINE HCL 50 MG/ML IJ SOLN
12.5000 mg | Freq: Four times a day (QID) | INTRAMUSCULAR | Status: DC | PRN
  Administered 2012-07-21: 12.5 mg via INTRAVENOUS
  Filled 2012-07-21: qty 1

## 2012-07-21 MED ORDER — ONDANSETRON HCL 4 MG PO TABS: 4.0000 mg | ORAL_TABLET | ORAL | Status: DC | PRN

## 2012-07-21 NOTE — Progress Notes (Signed)
Post Partum Day 1 Subjective: Much more comfortable with PCA.  More lucid than after Fentanyl.  No nausea or vomiting.  Some dizziness.  Hungry.  Objective: Blood pressure 115/58, pulse 110, temperature 98.6 F (37 C), temperature source Axillary, resp. rate 18, height 5\' 4"  (1.626 m), weight 163 lb (73.936 kg), last menstrual period 10/11/2011, SpO2 99.00%, is breastfeeding.  Physical Exam:  General: mild distress and slightly drowsy, but can answer questions within context. Lochia: moderate Uterine Fundus: firm with mild tenderness Incision: stable external vulvar edema.  No internal vaginal exam done at this time DVT Evaluation: No evidence of DVT seen on physical exam. I/O last 3 completed shifts: In: 2214.4 [P.O.:290; I.V.:1924.4] Out: 1185 [Urine:385; Blood:800] Total I/O In: 125 [I.V.:125] Out: 50 [Urine:50]     Recent Labs  07/21/12 0516 07/21/12 0802  HGB 11.3* 9.1*  HCT 32.2* 26.3*    ASSESSMENT Continued decrease in hgb, most consistent with hematoma Decreased BP and tachycardia with maintenance of adequate urine output. Improved pain control and cognition with PCA dilaudid  PLAN Transfer to A ICU for more intensive monitoring Transfuse 2 units of packed red blood cells. This recommendation has been discussed with the patient and father of the baby. The risks and benefits of transfusion at this time. Have been reviewed.  The patient's questions have been answered and she consents to proceed with transfusion. We will plan operative intervention as a last resort as in most cases. A single bleeding site is very difficult to find in postpartum hematomas and tamponade can be expected over time. Will recheck hemoglobin per post transfusion protocol. 24-hour urine remains underway until 1400 today    LOS: 1 day   Hannah Nelson P 07/21/2012, 9:50 AM

## 2012-07-21 NOTE — Progress Notes (Signed)
Post Partum Day 1 Subjective: Pt c/o continued pain.  PCA not yet started  Objective: Blood pressure 160/110, pulse 90, temperature 99.5 F (37.5 C), temperature source Oral, resp. rate 22, height 5\' 4"  (1.626 m), weight 163 lb (73.936 kg), last menstrual period 10/11/2011, SpO2 97.00%, unknown if currently breastfeeding.  Most current bp is 136/76  U/o last hr=100cc   Recent Labs  07/20/12 1130 07/21/12 0516  HGB 13.5 11.3*  HCT 38.8 32.2*    Assessment/Plan Possible vaginal hematoma Recheck hgb in 1 hr  Transfuse as needed   LOS: 1 day   Elexia Friedt P 07/21/2012, 6:50 AM

## 2012-07-21 NOTE — Progress Notes (Signed)
In to see patient--she is sleeping soundly. PCA in place. Filed Vitals:   07/21/12 0500 07/21/12 0617 07/21/12 0756 07/21/12 0853  BP: 160/110 135/77 118/82 105/67  Pulse: 90 88 115 121  Temp:  99.1 F (37.3 C) 98.6 F (37 C)   TempSrc:   Axillary   Resp: 22 22 20 18   Height:      Weight:      SpO2:  97% 99%    CBC pending--drawn at 8:02p  Will await CBC. Dr. Pennie Rushing will f/u.  Nigel Bridgeman, CNM 07/21/12 8:15a

## 2012-07-21 NOTE — Progress Notes (Signed)
Post Partum Day 1 Subjective: Called to see pt who delivered at about 1130 pm with increasing rectal pain.  She had a 2nd degree right vaginal wall laceration which was repaired and required cytotec during the immediate post delivery period for increased uterine bleeding with good response.  She denies abdominal pain except intermittent cramps.  The nurses noted a large clot when she got up to the bathroom with an estimated 300cc at that time.  Objective: Blood pressure 155/87, pulse 69, temperature 99.5 F (37.5 C), temperature source Oral, resp. rate 18, height 5\' 4"  (1.626 m), weight 163 lb (73.936 kg), last menstrual period 10/11/2011, SpO2 100.00%, is currently breastfeeding.  Physical Exam:  General: alert, severe distress and feels most comfortable on right side. Lochia: Increased on initial nursing assessment, but no clots obtained on manual vaginal exam Uterine Fundus: firm, appropriately tender and slightly displaced to the right Incision: 3+ labial edema bilaterally.  Vaginal exam consistent with either significant right lateral wall edema vs hematoma. Foley cath in place and draining Rectovaginal :  No RV septal mass, but right lateral wall edema vs hematoma can be felt rectally as well DVT Evaluation: No evidence of DVT seen on physical exam.   Recent Labs  07/20/12 1130  HGB 13.5  HCT 38.8    Assessment/Plan: Cannot r/o right sidewall hematoma. No significant uterine bleeding at this time  Plan:  CBC, Type and cross drawn. Re check cbc in 2 hours with urine output. Continue pitocin in IV fluids IV pain meds given for exam, continue oral pain meds Sign consent for hematoma evacuation as needed.   LOS: 1 day   Corene Resnick P 07/21/2012, 5:17 AM

## 2012-07-21 NOTE — Anesthesia Postprocedure Evaluation (Signed)
  Anesthesia Post-op Note  Patient: Hannah Nelson  Procedure(s) Performed: * No procedures listed *  Patient Location: A-ICU  Anesthesia Type:Epidural  Level of Consciousness: awake, oriented and patient cooperative  Airway and Oxygen Therapy: Patient Spontanous Breathing  Post-op Pain: moderate  Post-op Assessment: Patient visited. Per patient epidural during labor inadequate, only legs numb. Currently has post vaginal delivery hematoma with moderate pain. Hgb started at 13, now 9.1, tachcardia and lower normal rage blood pressure.  Blood transfusion ordered by physician for hemodynamic hemodynamic stablization.  Post-op Vital Signs: O2 Saturation 99%, BP 96/72, HR 114.  Complications: No apparent anesthesia complications

## 2012-07-22 DIAGNOSIS — O99891 Other specified diseases and conditions complicating pregnancy: Secondary | ICD-10-CM

## 2012-07-22 LAB — CBC
HCT: 26.4 % — ABNORMAL LOW (ref 36.0–46.0)
Hemoglobin: 9.4 g/dL — ABNORMAL LOW (ref 12.0–15.0)
Hemoglobin: 9.3 g/dL — ABNORMAL LOW (ref 12.0–15.0)
MCH: 28.6 pg (ref 26.0–34.0)
RBC: 3.25 MIL/uL — ABNORMAL LOW (ref 3.87–5.11)
RDW: 14.8 % (ref 11.5–15.5)
WBC: 14.9 10*3/uL — ABNORMAL HIGH (ref 4.0–10.5)
WBC: 16.8 10*3/uL — ABNORMAL HIGH (ref 4.0–10.5)

## 2012-07-22 NOTE — Progress Notes (Signed)
Post Partum Day 2  Subjective: Feels better. No headaches, blurred vision, or RUQ tenderness. Pain is better.  Objective: Blood pressure 136/84, pulse 89, temperature 98.4 F (36.9 C), temperature source Oral, resp. rate 18, height 5\' 4"  (1.626 m), weight 163 lb (73.936 kg), last menstrual period 10/11/2011, SpO2 100.00%, unknown if currently breastfeeding.  Physical Exam:  General: no distress Chest: clear Heart: RRR Abd: No RUQ pain/tenderness Lochia: appropriate Uterine Fundus: firm Incision: NA DVT Evaluation: No evidence of DVT seen on physical exam.  CBC    Component Value Date/Time   WBC 14.9* 07/22/2012 0550   RBC 3.18* 07/22/2012 0550   HGB 9.4* 07/22/2012 0550   HCT 26.4* 07/22/2012 0550   PLT 119* 07/22/2012 0550   MCV 83.0 07/22/2012 0550   MCH 29.6 07/22/2012 0550   MCHC 35.6 07/22/2012 0550   RDW 14.8 07/22/2012 0550   LYMPHSABS 2.5 12/20/2011 0921   MONOABS 0.6 12/20/2011 0921   EOSABS 0.1 12/20/2011 0921   BASOSABS 0.0 12/20/2011 0921   CMP     Component Value Date/Time   NA 136 07/20/2012 1130   K 3.7 07/20/2012 1130   CL 105 07/20/2012 1130   CO2 21 07/20/2012 1130   GLUCOSE 76 07/20/2012 1130   BUN 9 07/20/2012 1130   CREATININE 0.99 07/20/2012 1130   CALCIUM 9.0 07/20/2012 1130   PROT 5.3* 07/20/2012 1130   ALBUMIN 2.4* 07/20/2012 1130   AST 20 07/20/2012 1130   ALT 11 07/20/2012 1130   ALKPHOS 217* 07/20/2012 1130   BILITOT 0.3 07/20/2012 1130   GFRNONAA 81* 07/20/2012 1130   GFRAA >90 07/20/2012 1130    Recent Labs  07/21/12 1800 07/22/12 0550  HGB 9.8* 9.4*  HCT 27.6* 26.4*    Assessment/Plan: Discharge home tomorrow. Breastfeeding.  Preeclampsia - stable. Hematoma/ anemia-stable Thrombocytopenia.  Stop magnesium at 7:00 p.m. Ambulate. Check platelets at 6:00 p.m.   LOS: 2 days   Kobi Aller V 07/22/2012, 10:37 AM

## 2012-07-23 LAB — CBC
HCT: 26.6 % — ABNORMAL LOW (ref 36.0–46.0)
HCT: 33.9 % — ABNORMAL LOW (ref 36.0–46.0)
Hemoglobin: 9 g/dL — ABNORMAL LOW (ref 12.0–15.0)
Hemoglobin: 11.7 g/dL — ABNORMAL LOW (ref 12.0–15.0)
MCH: 29 pg (ref 26.0–34.0)
MCV: 83.4 fL (ref 78.0–100.0)
MCV: 83.9 fL (ref 78.0–100.0)
RBC: 3.19 MIL/uL — ABNORMAL LOW (ref 3.87–5.11)
RBC: 4.04 MIL/uL (ref 3.87–5.11)
RDW: 14.9 % (ref 11.5–15.5)
WBC: 17.9 10*3/uL — ABNORMAL HIGH (ref 4.0–10.5)
WBC: 20.7 10*3/uL — ABNORMAL HIGH (ref 4.0–10.5)

## 2012-07-23 LAB — PROTEIN, URINE, 24 HOUR
Collection Interval-UPROT: 24 hours
Urine Total Volume-UPROT: 2040 mL

## 2012-07-23 MED ORDER — ACETAMINOPHEN 325 MG PO TABS
650.0000 mg | ORAL_TABLET | Freq: Once | ORAL | Status: AC
  Administered 2012-07-23: 650 mg via ORAL
  Filled 2012-07-23: qty 2
  Filled 2012-07-23: qty 1

## 2012-07-23 MED ORDER — DIPHENHYDRAMINE HCL 25 MG PO CAPS
25.0000 mg | ORAL_CAPSULE | Freq: Once | ORAL | Status: AC
  Administered 2012-07-23: 25 mg via ORAL
  Filled 2012-07-23: qty 1

## 2012-07-23 NOTE — Progress Notes (Signed)
UR chart review completed.  

## 2012-07-23 NOTE — Progress Notes (Signed)
sitting

## 2012-07-23 NOTE — Progress Notes (Signed)
Patient ID: Hannah Nelson, female   DOB: 07-15-1990, 22 y.o.   MRN: [id number] Pt stable s/p transfusion of four units.  Will transfer to the floor

## 2012-07-23 NOTE — Progress Notes (Signed)
Lying down

## 2012-07-23 NOTE — Progress Notes (Addendum)
Post Partum Day 2: S/P SVD with 2nd degree lac  Subjective: Patient has not been up yet.  Reports feelling weak.  Tolerating PO, denies any HA/N/V/RUQ pain, working on BF. Foley remains in place   Feeding:  Breastfeeding/pumping Contraceptive plan:   Unknown at this time  Objective: Blood pressure 130/75, pulse 98, temperature 98.8 F (37.1 C), temperature source Oral, resp. rate 16, height 5\' 4"  (1.626 m), weight 163 lb (73.936 kg), last menstrual period 10/11/2011, SpO2 100.00%, unknown if currently breastfeeding.  Filed Vitals:   07/23/12 0700 07/23/12 0730 07/23/12 0841 07/23/12 0900  BP: 128/69 135/85 130/75   Pulse: 84   98  Temp:  98.6 F (37 C) 98.8 F (37.1 C)   TempSrc:  Oral Oral   Resp:  22 16   Height:      Weight:      SpO2: 98%   100%   Physical Exam:  General: alert, cooperative, fatigued and no distress Lochia: appropriate Uterine Fundus: firm Incision: healing well DVT Evaluation: No evidence of DVT seen on physical exam. Negative Homan's sign.  Results for orders placed during the hospital encounter of 07/20/12 (from the past 24 hour(s))  CBC     Status: Abnormal   Collection Time    07/22/12  5:46 PM      Result Value Range   WBC 16.8 (*) 4.0 - 10.5 K/uL   RBC 3.25 (*) 3.87 - 5.11 MIL/uL   Hemoglobin 9.3 (*) 12.0 - 15.0 g/dL   HCT 14.7 (*) 82.9 - 56.2 %   MCV 83.4  78.0 - 100.0 fL   MCH 28.6  26.0 - 34.0 pg   MCHC 34.3  30.0 - 36.0 g/dL   RDW 13.0  86.5 - 78.4 %   Platelets 112 (*) 150 - 400 K/uL  PREPARE RBC (CROSSMATCH)     Status: None   Collection Time    07/23/12  2:00 AM      Result Value Range   Order Confirmation ORDER PROCESSED BY BLOOD BANK    CBC     Status: Abnormal   Collection Time    07/23/12  2:10 AM      Result Value Range   WBC 17.9 (*) 4.0 - 10.5 K/uL   RBC 3.19 (*) 3.87 - 5.11 MIL/uL   Hemoglobin 9.0 (*) 12.0 - 15.0 g/dL   HCT 69.6 (*) 29.5 - 28.4 %   MCV 83.4  78.0 - 100.0 fL   MCH 28.2  26.0 - 34.0 pg   MCHC 33.8   30.0 - 36.0 g/dL   RDW 13.2  44.0 - 10.2 %   Platelets 117 (*) 150 - 400 K/uL    Recent Labs  07/22/12 1746 07/23/12 0210  HGB 9.3* 9.0*  HCT 27.1* 26.6*    Assessment/Plan: S/P Vaginal delivery day 2 Symptomatic anemia - received 2 units through the night (2 units 5/4 as well) Thrombocytopenia 24 hour mag sulfate off 1900 yesterday  Continue current care Dr Normand Sloop to follow   LOS: 3 days   OXLEY, JENNIFER 07/23/2012, 9:44 AM   Pt feeling better. Will check cbc at noon. If stable will d/c folely and transfer to the floor

## 2012-07-23 NOTE — Progress Notes (Signed)
S. Lillard in to assess pt's on the unit. Orthostatics done on pt. As requested by Dr. Stefano Gaul and reported them now to S. Lillard who is in phone contact with Dr. Stefano Gaul. Orthostatic changes significant and pt. Very weak and dizzy upon standing. New orders received. Pt. Resting comfortably after assisted back to bed

## 2012-07-23 NOTE — Progress Notes (Signed)
standing

## 2012-07-23 NOTE — Progress Notes (Signed)
Patient ID: Hannah Nelson, female   DOB: 01/28/1991, 22 y.o.   MRN: [id number] Post Partum Day 3  Subjective: Doing better, c/o cramping and perineal pain but controlled w PO meds, still unable to ambulate, feeling weak and dizzy when attempts made, tolerating PO,  Denies any HA/N/V/RUQ pain,  Working on BF Mood stable, bonding well Foley remains in place    Objective: Blood pressure 129/78, pulse 80, temperature 98.9 F (37.2 C), temperature source Oral, resp. rate 16, height 5\' 4"  (1.626 m), weight 163 lb (73.936 kg), last menstrual period 10/11/2011, SpO2 99.00%, unknown if currently breastfeeding.  Filed Vitals:   07/23/12 0115 07/23/12 0200 07/23/12 0309 07/23/12 0324  BP: 139/96 129/85 140/81 129/78  Pulse: 93 91 103 80  Temp:   98.5 F (36.9 C) 98.9 F (37.2 C)  TempSrc:   Oral Oral  Resp:   18 16  Height:      Weight:      SpO2: 100% 100% 98% 99%   Orthostatic vitals at 0100, w significant drop in BP upon standing, HR stable   Physical Exam:  General: NAD  Lochia: appropriate Uterine Fundus: firm Perineum: healing, mild labial swelling noted  DVT Evaluation: No evidence of DVT seen on physical exam. Negative Homan's sign. No significant calf/ankle edema. Reflexes brisk bilaterally, 2 beats of clonus    Recent Labs  07/22/12 1746 07/23/12 0210  HGB 9.3* 9.0*  HCT 27.1* 26.6*    Assessment/Plan: Symptomatic anemia, rcv'd 2 units PRBC's on 5/4 Magnesium off since 7pm - rcv'd 24 hours for pre-eclampsia Thrombocytopenia   D/w Dr Stefano Gaul Will check CBC now, and tx 2 more units PRBC's And repeat cbc and orthostatics in the AM,  Discussed plan w pt and agrees      S.Telvin Reinders, CNM  07/21/2012 at 0130am

## 2012-07-23 NOTE — Progress Notes (Addendum)
Lying on cuff, will re-check

## 2012-07-23 NOTE — Progress Notes (Signed)
Report called to Wallace, RN on [facility].  Pt for transfer to room 104.

## 2012-07-23 NOTE — Clinical Social Work Maternal (Signed)
    Clinical Social Work Department PSYCHOSOCIAL ASSESSMENT - MATERNAL/CHILD 07/23/2012  Patient:  Hannah Nelson, Hannah Nelson  Account Number:  [id number]  Admit Date:  07/20/2012  Marjo Bicker Name:   Hannah Nelson    Clinical Social Worker:  Nobie Putnam, LCSW   Date/Time:  07/23/2012 11:41 AM  Date Referred:  07/23/2012   Referral source  CN     Referred reason  Substance Abuse   Other referral source:    I:  FAMILY / HOME ENVIRONMENT Child's legal guardian:  PARENT  Guardian - Name Guardian - Age Guardian - Address  Hannah Nelson 22 [address] [place]; [place], [place] [address]  Hannah Nelson 34 (same as above)   Other household support members/support persons Other support:   Hannah Nelson, pt's mother    II  PSYCHOSOCIAL DATA Information Source:  Patient Interview  Surveyor, quantity and Community Resources Employment:   Financial resources:  [employer] If Medicaid - County:  [place] Other  Sales executive  WIC   School / Grade:   Maternity Care Coordinator / Child Services Coordination / Early Interventions:  Cultural issues impacting care:    III  STRENGTHS Strengths  Adequate Resources  Home prepared for Child (including basic supplies)  Supportive family/friends   Strength comment:    IV  RISK FACTORS AND CURRENT PROBLEMS Current Problem:  YES   Risk Factor & Current Problem Patient Issue Family Issue Risk Factor / Current Problem Comment  Substance Abuse Y N Hx of MJ    V  SOCIAL WORK ASSESSMENT CSW referral received to assess pt's history of MJ use.  Pt admits to smoking MJ "daily" prior to pregnancy confirmation at 3 months.  Once pregnancy was confirmed, she continued to smoke for 1 week before she stopped use. Pt told CSW that she also stopped smoking cigarettes during the pregnancy.  She denies other illegal substance use & verbalized understanding of hospital drug testing policy. UDS is negative, meconium results are pending.  She lives with FOB, who she  describes as her primary support person. She also identified her mother as a support person, as well.  She denies any history of depression.  CSW observed pt bonding well with the infant & appears appropriate at this time.  CSW will continue to monitor drug screen results & make a referral if needed.      VI SOCIAL WORK PLAN Social Work Plan  No Further Intervention Required / No Barriers to Discharge   Type of pt/family education:   If child protective services report - county:   If child protective services report - date:   Information/referral to community resources comment:   Other social work plan:

## 2012-07-24 LAB — TYPE AND SCREEN
ABO/RH(D): B POS
Antibody Screen: NEGATIVE
Unit division: 0

## 2012-07-24 LAB — CBC WITH DIFFERENTIAL/PLATELET
Basophils Absolute: 0 10*3/uL (ref 0.0–0.1)
Basophils Relative: 0 % (ref 0–1)
Eosinophils Absolute: 0.5 10*3/uL (ref 0.0–0.7)
MCH: 29.2 pg (ref 26.0–34.0)
MCHC: 34.6 g/dL (ref 30.0–36.0)
Neutrophils Relative %: 72 % (ref 43–77)
Platelets: 129 10*3/uL — ABNORMAL LOW (ref 150–400)
RBC: 3.73 MIL/uL — ABNORMAL LOW (ref 3.87–5.11)
RDW: 15 % (ref 11.5–15.5)

## 2012-07-24 MED ORDER — IBUPROFEN 600 MG PO TABS: 600.0000 mg | ORAL_TABLET | Freq: Four times a day (QID) | ORAL | Status: DC

## 2012-07-24 MED ORDER — FERROUS SULFATE 325 (65 FE) MG PO TABS: 325.0000 mg | ORAL_TABLET | Freq: Every day | ORAL | Status: AC

## 2012-07-24 NOTE — Discharge Summary (Signed)
  Vaginal Delivery Discharge Summary  Hannah Nelson  DOB:    [birth year] MRN:    [id number] CSN:    [id number]  Date of admission:                  07/20/12  Date of discharge:                   07/24/12  Procedures this admission: SVD with repair of 2nd degree lac   Date of Delivery: 07/20/12 by Hannah Nelson  Newborn Data:  Live born female  Birth Weight: 7 lb 0.5 oz (3190 g) APGAR: 9, 9  Home with mother. Circumcision Plan: outpatient with other facility  History of Present Illness:  Ms. Hannah Nelson is a 22 y.o. female, G1P1001, who presents at [redacted]w[redacted]d weeks gestation. The patient has been followed at the [facility] and Gynecology division of [facility]. She was admitted onset of labor. Her pregnancy has been complicated by:   Patient Active Problem List   Diagnosis Date Noted  . Maternal blood transfusion 07/22/2012  . Vaginal delivery 07/21/2012  . Vaginal hematoma 07/21/2012  . Preeclampsia 07/20/2012  . Umbilical vein abnormality complicating pregnancy 02/27/2012  . Hx Sexually transmitted diseases (STD) 12/29/2011  . Low weight 12/29/2011   Hospital course:  The patient was admitted for labor.   Her labor was complicated by elevated BPs; 24 hour urine begun. She proceeded to have a vaginal delivery of a healthy infant. Her delivery was not complicated, however just after there was mild persistent uterine atony, cytotec given PR with good results.  Her postpartum course was complicated, by vaginal hematoma and anemia requiring 2 unit transfusion, and 24 hour Mag sulfate with good response.  She was discharged to home on postpartum day 4 doing well.  Feeding:  breast  Contraception:  Planning nexplanon  Discharge hemoglobin:  Hemoglobin  Date Value Range Status  07/24/2012 10.9* 12.0 - 15.0 g/dL Final     HCT  Date Value Range Status  07/24/2012 31.5* 36.0 - 46.0 % Final    Discharge Physical Exam:   General: alert,  cooperative and no distress Lochia: appropriate Uterine Fundus: firm Incision: healing well DVT Evaluation: No evidence of DVT seen on physical exam. Negative Homan's sign.  Intrapartum Procedures: spontaneous vaginal delivery Postpartum Procedures: transfusion 2 units Complications-Operative and Postpartum: 2nd degree perineal laceration and vaginal hematoma  Discharge Diagnoses: Term Pregnancy-delivered and preeclampsia with 24 hour Mag, vaginal hematoma and anemia with 2 units of blood transfused  Discharge Information:  Activity:           per [facility] handbook Diet:                routine Medications: Ibuprofen and Iron Condition:      stable Instructions:  refer to practice specific booklet Discharge to: home  [facility] visit scheduled for 1 week for BP check   Hannah Nelson 07/24/2012

## 2012-07-25 ENCOUNTER — Other Ambulatory Visit: Payer: Self-pay | Admitting: Obstetrics and Gynecology

## 2012-07-25 ENCOUNTER — Encounter (HOSPITAL_COMMUNITY): Payer: Self-pay | Admitting: *Deleted

## 2012-07-25 ENCOUNTER — Inpatient Hospital Stay (HOSPITAL_COMMUNITY)
Admission: AD | Admit: 2012-07-25 | Discharge: 2012-07-27 | DRG: 776 | Disposition: A | Discharge status: Home / Self Care | Payer: Medicaid Other | Source: Ambulatory Visit | Attending: Obstetrics and Gynecology | Admitting: Obstetrics and Gynecology

## 2012-07-25 DIAGNOSIS — O864 Pyrexia of unknown origin following delivery: Principal | ICD-10-CM | POA: Diagnosis present

## 2012-07-25 DIAGNOSIS — M545 Low back pain, unspecified: Secondary | ICD-10-CM | POA: Diagnosis present

## 2012-07-25 DIAGNOSIS — N390 Urinary tract infection, site not specified: Secondary | ICD-10-CM | POA: Diagnosis present

## 2012-07-25 DIAGNOSIS — O2390 Unspecified genitourinary tract infection in pregnancy, unspecified trimester: Secondary | ICD-10-CM | POA: Diagnosis present

## 2012-07-25 LAB — CBC WITH DIFFERENTIAL/PLATELET
Basophils Relative: 0 % (ref 0–1)
Eosinophils Absolute: 0.3 10*3/uL (ref 0.0–0.7)
HCT: 36.6 % (ref 36.0–46.0)
Hemoglobin: 12.9 g/dL (ref 12.0–15.0)
Lymphs Abs: 2.3 10*3/uL (ref 0.7–4.0)
MCH: 29.9 pg (ref 26.0–34.0)
MCHC: 35.2 g/dL (ref 30.0–36.0)
Monocytes Absolute: 1.2 10*3/uL — ABNORMAL HIGH (ref 0.1–1.0)
Monocytes Relative: 5 % (ref 3–12)
RBC: 4.32 MIL/uL (ref 3.87–5.11)

## 2012-07-25 LAB — COMPREHENSIVE METABOLIC PANEL
Albumin: 3 g/dL — ABNORMAL LOW (ref 3.5–5.2)
BUN: 11 mg/dL (ref 6–23)
Chloride: 100 mEq/L (ref 96–112)
Creatinine, Ser: 0.99 mg/dL (ref 0.50–1.10)
GFR calc Af Amer: >90 mL/min (ref >90)
Glucose, Bld: 79 mg/dL (ref 70–99)
Total Bilirubin: 1 mg/dL (ref 0.3–1.2)

## 2012-07-25 LAB — URINE MICROSCOPIC-ADD ON

## 2012-07-25 LAB — URINALYSIS, ROUTINE W REFLEX MICROSCOPIC
Glucose, UA: NEGATIVE mg/dL
Ketones, ur: >80 mg/dL — AB
Nitrite: NEGATIVE
Protein, ur: 30 mg/dL — AB
Urobilinogen, UA: 1 mg/dL (ref 0.0–1.0)

## 2012-07-25 MED ORDER — DIBUCAINE 1 % RE OINT: 1.0000 "application " | TOPICAL_OINTMENT | RECTAL | Status: DC | PRN

## 2012-07-25 MED ORDER — PRENATAL MULTIVITAMIN CH
1.0000 | ORAL_TABLET | Freq: Every day | ORAL | Status: DC
  Administered 2012-07-26: 1 via ORAL
  Filled 2012-07-25: qty 1

## 2012-07-25 MED ORDER — SIMETHICONE 80 MG PO CHEW: 80.0000 mg | CHEWABLE_TABLET | ORAL | Status: DC | PRN

## 2012-07-25 MED ORDER — OXYCODONE-ACETAMINOPHEN 5-325 MG PO TABS
1.0000 | ORAL_TABLET | ORAL | Status: DC | PRN
  Administered 2012-07-26 – 2012-07-27 (×8): 2 via ORAL
  Filled 2012-07-25 (×8): qty 2

## 2012-07-25 MED ORDER — DOCUSATE SODIUM 100 MG PO CAPS
100.0000 mg | ORAL_CAPSULE | Freq: Two times a day (BID) | ORAL | Status: DC
  Administered 2012-07-25 – 2012-07-27 (×4): 100 mg via ORAL
  Filled 2012-07-25 (×4): qty 1

## 2012-07-25 MED ORDER — LACTATED RINGERS IV SOLN
INTRAVENOUS | Status: DC
  Administered 2012-07-25: 20:00:00 via INTRAVENOUS

## 2012-07-25 MED ORDER — SODIUM CHLORIDE 0.9 % IV SOLN
3.0000 g | Freq: Four times a day (QID) | INTRAVENOUS | Status: DC
  Administered 2012-07-25 – 2012-07-27 (×7): 3 g via INTRAVENOUS
  Filled 2012-07-25 (×8): qty 3

## 2012-07-25 MED ORDER — WITCH HAZEL-GLYCERIN EX PADS: 1.0000 "application " | MEDICATED_PAD | CUTANEOUS | Status: DC | PRN

## 2012-07-25 MED ORDER — DIPHENHYDRAMINE HCL 25 MG PO CAPS: 25.0000 mg | ORAL_CAPSULE | Freq: Four times a day (QID) | ORAL | Status: DC | PRN

## 2012-07-25 MED ORDER — ZOLPIDEM TARTRATE 5 MG PO TABS: 5.0000 mg | ORAL_TABLET | Freq: Every evening | ORAL | Status: DC | PRN

## 2012-07-25 MED ORDER — LACTATED RINGERS IV SOLN
INTRAVENOUS | Status: DC
  Administered 2012-07-25 – 2012-07-27 (×5): via INTRAVENOUS

## 2012-07-25 MED ORDER — OXYCODONE-ACETAMINOPHEN 5-325 MG PO TABS
2.0000 | ORAL_TABLET | Freq: Once | ORAL | Status: AC
  Administered 2012-07-25: 2 via ORAL
  Filled 2012-07-25: qty 2

## 2012-07-25 MED ORDER — ONDANSETRON HCL 4 MG/2ML IJ SOLN: 4.0000 mg | INTRAMUSCULAR | Status: DC | PRN

## 2012-07-25 MED ORDER — IBUPROFEN 600 MG PO TABS
600.0000 mg | ORAL_TABLET | Freq: Four times a day (QID) | ORAL | Status: DC
  Administered 2012-07-25 – 2012-07-27 (×6): 600 mg via ORAL
  Filled 2012-07-25 (×6): qty 1

## 2012-07-25 MED ORDER — PHENAZOPYRIDINE HCL 100 MG PO TABS
100.0000 mg | ORAL_TABLET | Freq: Three times a day (TID) | ORAL | Status: DC
  Administered 2012-07-26 – 2012-07-27 (×4): 100 mg via ORAL
  Filled 2012-07-25 (×5): qty 1

## 2012-07-25 MED ORDER — ONDANSETRON HCL 4 MG PO TABS: 4.0000 mg | ORAL_TABLET | ORAL | Status: DC | PRN

## 2012-07-25 NOTE — H&P (Signed)
Hannah Nelson is an 21 y.o. female. G1P1 PPD5, S/p SVD of viable female infant on 07/20/12, w repair of 2nd degree laceration, following delivery a vaginal hematoma was noted, pt rcv'd pca pain meds, blood transfusion, and hematoma resolved. Pt rcv'd a 24hr course of magnesium sulfate for pre-eclampsia,  Hannah Nelson was dc'd home on PPD4 (07/24/12). Hannah Nelson returns to MAU tonight w report of temp at home of 101.4, increasing pain and bleeding. Hannah Nelson c/o feeling weak when trying to ambulate. Hannah Nelson denies any nausea or vomiting. Hannah Nelson c/o dysuria, but states Hannah Nelson had that yesterday, as well.    Hannah Nelson is not breastfeeding,       Past Medical History  Diagnosis Date  . No pertinent past medical history   . Chlamydia June 2013  . Gonorrhea   . Infection     BV;may get frequently;currently has BV and meds rx'd  . Infection     UTI;not frequently    Past Surgical History  Procedure Laterality Date  . No past surgeries      Family History  Problem Relation Age of Onset  . Heart disease Father     deceased;died before pt was born    Social History:  reports that Hannah Nelson quit smoking about 7 months ago. Hannah Nelson has never used smokeless tobacco. Hannah Nelson reports that Hannah Nelson uses illicit drugs (Marijuana) about 7 times per week. Hannah Nelson reports that Hannah Nelson does not drink alcohol. Accompanied by sig other/FOB, he is involved and supportive, infant at home w family  Allergies: No Known Allergies  Prescriptions prior to admission  Medication Sig Dispense Refill  . ferrous sulfate 325 (65 FE) MG tablet Take 1 tablet (325 mg total) by mouth daily with breakfast.  30 tablet  3  . ibuprofen (ADVIL,MOTRIN) 600 MG tablet Take 1 tablet (600 mg total) by mouth every 6 (six) hours.  30 tablet  1  . Prenatal-Fe Cbn-DSS-FA-DHA (PREQUE 10) 15-25-0.5-50 MG TABS Take 1 tablet by mouth at bedtime.      . [DISCONTINUED] Prenatal-Fe Cbn-DSS-FA-DHA (PREQUE 10) 15-25-0.5-50 MG TABS Take 2 tablets by mouth once.  60 tablet  12    Review of Systems   Constitutional: Positive for fever.  HENT: Negative for congestion and neck pain.   Eyes: Negative for blurred vision and double vision.  Respiratory: Negative for cough and shortness of breath.   Cardiovascular: Negative for chest pain and leg swelling.  Gastrointestinal: Positive for abdominal pain. Negative for heartburn, nausea, vomiting, diarrhea and constipation.  Genitourinary: Positive for dysuria. Negative for urgency and frequency.  Musculoskeletal: Positive for myalgias and back pain.  Neurological: Positive for dizziness and weakness. Negative for tingling, loss of consciousness and headaches.  All other systems reviewed and are negative.    Blood pressure 122/79, pulse 104, temperature 99.8 F (37.7 C), temperature source Oral, resp. rate 16, height 5\' 4"  (1.626 m), weight 163 lb (73.936 kg), SpO2 97.00%. Physical Exam  Nursing note and vitals reviewed. Constitutional: Hannah Nelson is oriented to person, place, and time. Hannah Nelson appears well-developed and well-nourished. No distress.  HENT:  Head: Normocephalic.  Eyes: Pupils are equal, round, and reactive to light.  Neck: Normal range of motion.  Cardiovascular: Regular rhythm and normal heart sounds.   Tachycardia noted   Respiratory: Effort normal and breath sounds normal.  GI: Soft. Bowel sounds are normal. Hannah Nelson exhibits no distension and no mass. There is tenderness. There is guarding. There is no rebound.  Generalized tenderness, More over R suprapubic region  Fundus firm @U -4  Genitourinary:  Lochia small External exam normal Internal pelvic deferred   Musculoskeletal: Normal range of motion. Hannah Nelson exhibits no edema and no tenderness.  Neurological: Hannah Nelson is alert and oriented to person, place, and time. Hannah Nelson has normal reflexes.  Skin: Skin is warm and dry.  Psychiatric: Hannah Nelson has a normal mood and affect. Her behavior is normal.    Results for orders placed during the hospital encounter of 07/25/12 (from the past 24 hour(s))   URINALYSIS, ROUTINE W REFLEX MICROSCOPIC     Status: Abnormal   Collection Time    07/25/12  6:35 PM      Result Value Range   Color, Urine AMBER (*) YELLOW   APPearance CLOUDY (*) CLEAR   Specific Gravity, Urine 1.020  1.005 - 1.030   pH 6.0  5.0 - 8.0   Glucose, UA NEGATIVE  NEGATIVE mg/dL   Hgb urine dipstick LARGE (*) NEGATIVE   Bilirubin Urine SMALL (*) NEGATIVE   Ketones, ur >80 (*) NEGATIVE mg/dL   Protein, ur 30 (*) NEGATIVE mg/dL   Urobilinogen, UA 1.0  0.0 - 1.0 mg/dL   Nitrite NEGATIVE  NEGATIVE   Leukocytes, UA MODERATE (*) NEGATIVE  URINE MICROSCOPIC-ADD ON     Status: Abnormal   Collection Time    07/25/12  6:35 PM      Result Value Range   Squamous Epithelial / LPF RARE  RARE   WBC, UA 11-20  <3 WBC/hpf   RBC / HPF TOO NUMEROUS TO COUNT  <3 RBC/hpf   Bacteria, UA FEW (*) RARE  CBC WITH DIFFERENTIAL     Status: Abnormal   Collection Time    07/25/12  6:45 PM      Result Value Range   WBC 23.5 (*) 4.0 - 10.5 K/uL   RBC 4.32  3.87 - 5.11 MIL/uL   Hemoglobin 12.9  12.0 - 15.0 g/dL   HCT 16.1  09.6 - 04.5 %   MCV 84.7  78.0 - 100.0 fL   MCH 29.9  26.0 - 34.0 pg   MCHC 35.2  30.0 - 36.0 g/dL   RDW 40.9  81.1 - 91.4 %   Platelets 178  150 - 400 K/uL   Neutrophils Relative 84 (*) 43 - 77 %   Neutro Abs 19.7 (*) 1.7 - 7.7 K/uL   Lymphocytes Relative 10 (*) 12 - 46 %   Lymphs Abs 2.3  0.7 - 4.0 K/uL   Monocytes Relative 5  3 - 12 %   Monocytes Absolute 1.2 (*) 0.1 - 1.0 K/uL   Eosinophils Relative 1  0 - 5 %   Eosinophils Absolute 0.3  0.0 - 0.7 K/uL   Basophils Relative 0  0 - 1 %   Basophils Absolute 0.0  0.0 - 0.1 K/uL  COMPREHENSIVE METABOLIC PANEL     Status: Abnormal   Collection Time    07/25/12  6:45 PM      Result Value Range   Sodium 133 (*) 135 - 145 mEq/L   Potassium 4.1  3.5 - 5.1 mEq/L   Chloride 100  96 - 112 mEq/L   CO2 22  19 - 32 mEq/L   Glucose, Bld 79  70 - 99 mg/dL   BUN 11  6 - 23 mg/dL   Creatinine, Ser 7.82  0.50 - 1.10 mg/dL    Calcium 9.3  8.4 - 95.6 mg/dL   Total Protein 6.6  6.0 - 8.3 g/dL   Albumin 3.0 (*) 3.5 - 5.2  g/dL   AST 32  0 - 37 U/L   ALT 25  0 - 35 U/L   Alkaline Phosphatase 165 (*) 39 - 117 U/L   Total Bilirubin 1.0  0.3 - 1.2 mg/dL   GFR calc non Af Amer 81 (*) >90 mL/min   GFR calc Af Amer >90  >90 mL/min    No results found.  Assessment/Plan:  PPD5 Possible Postpartum endometritis  Possible UTI Vitals stable   Admit to 3rd floor per c/w Dr Pennie Rushing IVF's LR @125cc /hour unasyn 3g IVPB q6h Repeat cbc in the AM Send UA for cx    Mahira Petras M 07/25/2012, 9:13 PM

## 2012-07-25 NOTE — MAU Note (Addendum)
D/C yesterday and Heavy bleeding with few quarter size clots since D/C.  Constant pain in lower back radiating to leg.  Temp 101.5 at home and took tylenol.

## 2012-07-26 LAB — CBC WITH DIFFERENTIAL/PLATELET
Basophils Absolute: 0 10*3/uL (ref 0.0–0.1)
Basophils Relative: 0 % (ref 0–1)
HCT: 36.6 % (ref 36.0–46.0)
Lymphocytes Relative: 10 % — ABNORMAL LOW (ref 12–46)
MCHC: 34.4 g/dL (ref 30.0–36.0)
Neutro Abs: 17.9 10*3/uL — ABNORMAL HIGH (ref 1.7–7.7)
Neutrophils Relative %: 83 % — ABNORMAL HIGH (ref 43–77)
Platelets: 177 10*3/uL (ref 150–400)
RDW: 15 % (ref 11.5–15.5)
WBC: 21.6 10*3/uL — ABNORMAL HIGH (ref 4.0–10.5)

## 2012-07-26 NOTE — Progress Notes (Addendum)
Post Partum Day 6: Readmitted 5/8 (on PPD 5) for fever at home, increasing pain and bleeding.  Subjective: Today c/o suprapubic pain, and pain and burning with urination.   Patient up ad lib, denies syncope or dizziness.  Objective: Blood pressure 128/80, pulse 102, temperature 99.5 F (37.5 C), temperature source Oral, resp. rate 19, height 5\' 4"  (1.626 m), weight 163 lb (73.936 kg), SpO2 98.00%.  Physical Exam:  General: alert, cooperative and mild distress Lochia: appropriate CVAT noted on right side only Incision: healing well, denies pain DVT Evaluation: No evidence of DVT seen on physical exam. Negative Homan's sign.  Results for orders placed during the hospital encounter of 07/25/12 (from the past 24 hour(s))  URINALYSIS, ROUTINE W REFLEX MICROSCOPIC     Status: Abnormal   Collection Time    07/25/12  6:35 PM      Result Value Range   Color, Urine AMBER (*) YELLOW   APPearance CLOUDY (*) CLEAR   Specific Gravity, Urine 1.020  1.005 - 1.030   pH 6.0  5.0 - 8.0   Glucose, UA NEGATIVE  NEGATIVE mg/dL   Hgb urine dipstick LARGE (*) NEGATIVE   Bilirubin Urine SMALL (*) NEGATIVE   Ketones, ur >80 (*) NEGATIVE mg/dL   Protein, ur 30 (*) NEGATIVE mg/dL   Urobilinogen, UA 1.0  0.0 - 1.0 mg/dL   Nitrite NEGATIVE  NEGATIVE   Leukocytes, UA MODERATE (*) NEGATIVE  URINE MICROSCOPIC-ADD ON     Status: Abnormal   Collection Time    07/25/12  6:35 PM      Result Value Range   Squamous Epithelial / LPF RARE  RARE   WBC, UA 11-20  <3 WBC/hpf   RBC / HPF TOO NUMEROUS TO COUNT  <3 RBC/hpf   Bacteria, UA FEW (*) RARE  CBC WITH DIFFERENTIAL     Status: Abnormal   Collection Time    07/25/12  6:45 PM      Result Value Range   WBC 23.5 (*) 4.0 - 10.5 K/uL   RBC 4.32  3.87 - 5.11 MIL/uL   Hemoglobin 12.9  12.0 - 15.0 g/dL   HCT 16.1  09.6 - 04.5 %   MCV 84.7  78.0 - 100.0 fL   MCH 29.9  26.0 - 34.0 pg   MCHC 35.2  30.0 - 36.0 g/dL   RDW 40.9  81.1 - 91.4 %   Platelets 178  150 -  400 K/uL   Neutrophils Relative 84 (*) 43 - 77 %   Neutro Abs 19.7 (*) 1.7 - 7.7 K/uL   Lymphocytes Relative 10 (*) 12 - 46 %   Lymphs Abs 2.3  0.7 - 4.0 K/uL   Monocytes Relative 5  3 - 12 %   Monocytes Absolute 1.2 (*) 0.1 - 1.0 K/uL   Eosinophils Relative 1  0 - 5 %   Eosinophils Absolute 0.3  0.0 - 0.7 K/uL   Basophils Relative 0  0 - 1 %   Basophils Absolute 0.0  0.0 - 0.1 K/uL  COMPREHENSIVE METABOLIC PANEL     Status: Abnormal   Collection Time    07/25/12  6:45 PM      Result Value Range   Sodium 133 (*) 135 - 145 mEq/L   Potassium 4.1  3.5 - 5.1 mEq/L   Chloride 100  96 - 112 mEq/L   CO2 22  19 - 32 mEq/L   Glucose, Bld 79  70 - 99 mg/dL   BUN 11  6 - 23 mg/dL   Creatinine, Ser 2.13  0.50 - 1.10 mg/dL   Calcium 9.3  8.4 - 08.6 mg/dL   Total Protein 6.6  6.0 - 8.3 g/dL   Albumin 3.0 (*) 3.5 - 5.2 g/dL   AST 32  0 - 37 U/L   ALT 25  0 - 35 U/L   Alkaline Phosphatase 165 (*) 39 - 117 U/L   Total Bilirubin 1.0  0.3 - 1.2 mg/dL   GFR calc non Af Amer 81 (*) >90 mL/min   GFR calc Af Amer >90  >90 mL/min  CBC WITH DIFFERENTIAL     Status: Abnormal   Collection Time    07/26/12  5:00 AM      Result Value Range   WBC 21.6 (*) 4.0 - 10.5 K/uL   RBC 4.29  3.87 - 5.11 MIL/uL   Hemoglobin 12.6  12.0 - 15.0 g/dL   HCT 57.8  46.9 - 62.9 %   MCV 85.3  78.0 - 100.0 fL   MCH 29.4  26.0 - 34.0 pg   MCHC 34.4  30.0 - 36.0 g/dL   RDW 52.8  41.3 - 24.4 %   Platelets 177  150 - 400 K/uL   Neutrophils Relative 83 (*) 43 - 77 %   Neutro Abs 17.9 (*) 1.7 - 7.7 K/uL   Lymphocytes Relative 10 (*) 12 - 46 %   Lymphs Abs 2.1  0.7 - 4.0 K/uL   Monocytes Relative 6  3 - 12 %   Monocytes Absolute 1.2 (*) 0.1 - 1.0 K/uL   Eosinophils Relative 1  0 - 5 %   Eosinophils Absolute 0.3  0.0 - 0.7 K/uL   Basophils Relative 0  0 - 1 %   Basophils Absolute 0.0  0.0 - 0.1 K/uL    Assessment/Plan: Post Partum Day 6 UTI sx  C/w Dr. Normand Sloop Continue current care with Unasyn Q 6 hrs    LOS:  1 day   Hannah Nelson, Hannah Nelson 07/26/2012, 9:12 AM   Pt seen and examined agree with above.  She states the pain and bleeding are better.  Would continue abx for 24-48 hours.  If the pt remains afebirle will send home on PO abx

## 2012-07-27 LAB — URINE CULTURE: Colony Count: >=100000

## 2012-07-27 MED ORDER — AMOXICILLIN-POT CLAVULANATE 500-125 MG PO TABS: 1.0000 | ORAL_TABLET | Freq: Three times a day (TID) | ORAL | Status: AC

## 2012-07-27 NOTE — Progress Notes (Signed)
In to check patients vaginal bleeding.Large blue pad has a small amount ?60 cc's on it and no dripping blood or clots.Vital signs Bp-143/91,T-98.2,P-98,R-16.

## 2012-07-27 NOTE — Progress Notes (Signed)
Discharge instructions reviewed with patient.  Patient states understanding of home care, signs/symptoms to report to MD, medications, activity and return MD  Office visit.  No home equipment needed.  Patient ambulated in stable condition with staff without incident for discharge.

## 2012-07-27 NOTE — Progress Notes (Signed)
Went to check the patients vaginal bleeding and vital signs.BP132/77,T-98.7 oral,P-106,R-16.Pad has a very small amount of bright bleeding on it.I informed the patient that I was going to do a pad check again in an hour to see if anything had changed.

## 2012-07-27 NOTE — Progress Notes (Signed)
Vaginal bleeding this AM has not increased.Vital signs BP-132/80,T-97.9,P-92,18.Breasts are hard and have lumps in places.Ice to breast,explained to patient the use of Sport bra,cabbage,and use of ice.

## 2012-07-27 NOTE — Progress Notes (Signed)
Patient called out stated she needed some large blue pads.When I returned she said I feel a gush of blood.I walked with patient to the bathroom and her pad was saturated and had a few small clots.I told patient that I was going to check her pad in an hour and see what it looked liked.

## 2012-07-27 NOTE — Progress Notes (Signed)
In to check patients vaginal bleeding.Pad is about the same as it was an hour ago.Vital signs Bp-121/77,T-98 Oral,P-94,R-18.

## 2012-07-27 NOTE — Discharge Summary (Signed)
Admission for Postpartum Fever Discharge Summary  Hannah Nelson  DOB:    1990-08-20 MRN:    [id number] CSN:    [id number]  Date of admission:                  07/25/12  Date of discharge:                   07/27/12  Procedures this admission: Treated for PP fever  Date of Delivery: 07/20/12  SVD with repair of 2nd degree lac  Her labor was complicated by elevated BPs; 24 hour urine begun. She proceeded to have a vaginal delivery of a healthy infant. Her delivery was not complicated, however just after there was mild persistent uterine atony, cytotec given PR with good results. Her postpartum course was complicated, by vaginal hematoma and anemia requiring 2 unit transfusion, and 24 hour Mag sulfate with good response. She was discharged to home on postpartum day 4 doing well.  History of Present Illness:  Ms. Hannah Nelson is a 22 y.o. female, G1P1001, who presents on day PPD 5. The patient has been followed at the [facility] division of [facility]. She was admitted for PP fever.  On admission she presented to MAU reporting a temp at home of 101.4, increased pain, and bleeding. She complained of feeling weak when trying to ambulate. She denied any N/V. She c/o dysuria, but stated she had that the day before, as well.  Patient Active Problem List   Diagnosis Date Noted  . Postpartum fever 07/25/2012  . Vaginal delivery 07/21/2012  . Vaginal hematoma 07/21/2012   Hospital course:  The patient was admitted for PP fever.  Her hospital course was not complicated. She received Unasyn Q 6 hr since 5/8 at 2135, Motrin, Percocet, Pyridium.  She was discharged to home on day 2 doing well.  Afibrile since 5/8 at 1933   100.2  Discharge Labs:  Results for orders placed during the hospital encounter of 07/25/12 (from the past 48 hour(s))  URINALYSIS, ROUTINE W REFLEX MICROSCOPIC     Status: Abnormal   Collection Time    07/25/12  6:35 PM       Result Value Range   Color, Urine AMBER (*) YELLOW   Comment: BIOCHEMICALS MAY BE AFFECTED BY COLOR   APPearance CLOUDY (*) CLEAR   Specific Gravity, Urine 1.020  1.005 - 1.030   pH 6.0  5.0 - 8.0   Glucose, UA NEGATIVE  NEGATIVE mg/dL   Hgb urine dipstick LARGE (*) NEGATIVE   Bilirubin Urine SMALL (*) NEGATIVE   Ketones, ur >80 (*) NEGATIVE mg/dL   Protein, ur 30 (*) NEGATIVE mg/dL   Urobilinogen, UA 1.0  0.0 - 1.0 mg/dL   Nitrite NEGATIVE  NEGATIVE   Leukocytes, UA MODERATE (*) NEGATIVE  URINE MICROSCOPIC-ADD ON     Status: Abnormal   Collection Time    07/25/12  6:35 PM      Result Value Range   Squamous Epithelial / LPF RARE  RARE   WBC, UA 11-20  <3 WBC/hpf   RBC / HPF TOO NUMEROUS TO COUNT  <3 RBC/hpf   Bacteria, UA FEW (*) RARE  CBC WITH DIFFERENTIAL     Status: Abnormal   Collection Time    07/25/12  6:45 PM      Result Value Range   WBC 23.5 (*) 4.0 - 10.5 K/uL   RBC 4.32  3.87 - 5.11 MIL/uL  Hemoglobin 12.9  12.0 - 15.0 g/dL   HCT 16.1  09.6 - 04.5 %   MCV 84.7  78.0 - 100.0 fL   MCH 29.9  26.0 - 34.0 pg   MCHC 35.2  30.0 - 36.0 g/dL   RDW 40.9  81.1 - 91.4 %   Platelets 178  150 - 400 K/uL   Comment: DELTA CHECK NOTED     REPEATED TO VERIFY   Neutrophils Relative 84 (*) 43 - 77 %   Neutro Abs 19.7 (*) 1.7 - 7.7 K/uL   Lymphocytes Relative 10 (*) 12 - 46 %   Lymphs Abs 2.3  0.7 - 4.0 K/uL   Monocytes Relative 5  3 - 12 %   Monocytes Absolute 1.2 (*) 0.1 - 1.0 K/uL   Eosinophils Relative 1  0 - 5 %   Eosinophils Absolute 0.3  0.0 - 0.7 K/uL   Basophils Relative 0  0 - 1 %   Basophils Absolute 0.0  0.0 - 0.1 K/uL  COMPREHENSIVE METABOLIC PANEL     Status: Abnormal   Collection Time    07/25/12  6:45 PM      Result Value Range   Sodium 133 (*) 135 - 145 mEq/L   Potassium 4.1  3.5 - 5.1 mEq/L   Chloride 100  96 - 112 mEq/L   CO2 22  19 - 32 mEq/L   Glucose, Bld 79  70 - 99 mg/dL   BUN 11  6 - 23 mg/dL   Creatinine, Ser 7.82  0.50 - 1.10 mg/dL    Calcium 9.3  8.4 - 95.6 mg/dL   Total Protein 6.6  6.0 - 8.3 g/dL   Albumin 3.0 (*) 3.5 - 5.2 g/dL   AST 32  0 - 37 U/L   ALT 25  0 - 35 U/L   Alkaline Phosphatase 165 (*) 39 - 117 U/L   Total Bilirubin 1.0  0.3 - 1.2 mg/dL   GFR calc non Af Amer 81 (*) >90 mL/min   GFR calc Af Amer >90  >90 mL/min   Comment:            The eGFR has been calculated     using the CKD EPI equation.     This calculation has not been     validated in all clinical     situations.     eGFR's persistently     <90 mL/min signify     possible Chronic Kidney Disease.  URINE CULTURE     Status: None   Collection Time    07/25/12  9:34 PM      Result Value Range   Specimen Description URINE, CLEAN CATCH     Special Requests NONE     Culture  Setup Time 07/26/2012 04:07     Colony Count 60,000 COLONIES/ML     Culture       Value: GROUP B STREP(S.AGALACTIAE)ISOLATED     Note: TESTING AGAINST S. AGALACTIAE NOT ROUTINELY PERFORMED DUE TO PREDICTABILITY OF AMP/PEN/VAN SUSCEPTIBILITY.   Report Status 07/27/2012 FINAL    CBC WITH DIFFERENTIAL     Status: Abnormal   Collection Time    07/26/12  5:00 AM      Result Value Range   WBC 21.6 (*) 4.0 - 10.5 K/uL   RBC 4.29  3.87 - 5.11 MIL/uL   Hemoglobin 12.6  12.0 - 15.0 g/dL   HCT 21.3  08.6 - 57.8 %   MCV 85.3  78.0 -  100.0 fL   MCH 29.4  26.0 - 34.0 pg   MCHC 34.4  30.0 - 36.0 g/dL   RDW 81.1  91.4 - 78.2 %   Platelets 177  150 - 400 K/uL   Neutrophils Relative 83 (*) 43 - 77 %   Neutro Abs 17.9 (*) 1.7 - 7.7 K/uL   Lymphocytes Relative 10 (*) 12 - 46 %   Lymphs Abs 2.1  0.7 - 4.0 K/uL   Monocytes Relative 6  3 - 12 %   Monocytes Absolute 1.2 (*) 0.1 - 1.0 K/uL   Eosinophils Relative 1  0 - 5 %   Eosinophils Absolute 0.3  0.0 - 0.7 K/uL   Basophils Relative 0  0 - 1 %   Basophils Absolute 0.0  0.0 - 0.1 K/uL    Discharge Physical Exam:   General: alert, cooperative, fatigued and no distress Chest: clear Heart: RRR Extremities:  WNL   Discharge Diagnoses: PP fever  Discharge Information:  Activity:           Rest Diet:                routine Medications: PNV, Ibuprofen, Iron and Augmentin 500 mg Q 8 hr x 10 days Condition:      improved Instructions:  refer to practice specific booklet Discharge to: home  F/u in 1 week with Dr. Wynelle Bourgeois, [facility] 07/27/2012

## 2012-08-20 ENCOUNTER — Emergency Department (HOSPITAL_COMMUNITY)
Admission: EM | Admit: 2012-08-20 | Discharge: 2012-08-20 | Disposition: A | Discharge status: Home / Self Care | Payer: Medicaid Other | Attending: Emergency Medicine | Admitting: Emergency Medicine

## 2012-08-20 ENCOUNTER — Encounter (HOSPITAL_COMMUNITY): Payer: Self-pay | Admitting: *Deleted

## 2012-08-20 DIAGNOSIS — K5900 Constipation, unspecified: Secondary | ICD-10-CM | POA: Insufficient documentation

## 2012-08-20 DIAGNOSIS — Z3202 Encounter for pregnancy test, result negative: Secondary | ICD-10-CM | POA: Insufficient documentation

## 2012-08-20 DIAGNOSIS — Z79899 Other long term (current) drug therapy: Secondary | ICD-10-CM | POA: Insufficient documentation

## 2012-08-20 DIAGNOSIS — Z87891 Personal history of nicotine dependence: Secondary | ICD-10-CM | POA: Insufficient documentation

## 2012-08-20 DIAGNOSIS — Z8619 Personal history of other infectious and parasitic diseases: Secondary | ICD-10-CM | POA: Insufficient documentation

## 2012-08-20 DIAGNOSIS — R109 Unspecified abdominal pain: Secondary | ICD-10-CM | POA: Insufficient documentation

## 2012-08-20 LAB — URINE MICROSCOPIC-ADD ON

## 2012-08-20 LAB — URINALYSIS, ROUTINE W REFLEX MICROSCOPIC
Nitrite: NEGATIVE
Specific Gravity, Urine: 1.025 (ref 1.005–1.030)
Urobilinogen, UA: 1 mg/dL (ref 0.0–1.0)
pH: 6 (ref 5.0–8.0)

## 2012-08-20 MED ORDER — POLYETHYLENE GLYCOL 3350 17 GM/SCOOP PO POWD: ORAL | Status: DC

## 2012-08-20 NOTE — ED Provider Notes (Signed)
History     CSN: [id number]  Arrival date & time 08/20/12  1329   First MD Initiated Contact with Patient 08/20/12 1621      Chief Complaint  Patient presents with  . Constipation    (Consider location/radiation/quality/duration/timing/severity/associated sxs/prior treatment) HPI Comments: Pt states that she is constipated and has tried laxative with stool softners but no results.  No abdominal pain, just feeling of needing to have BM.  Pt is complaining of rectal pain.  PT concerned it is making it hard for her to urinate.  No vaginal discharge or bleeding. No dysuria.    Patient is a 22 y.o. female presenting with constipation. The history is provided by the patient. No language interpreter was used.  Constipation Severity:  Moderate Time since last bowel movement:  4 days Timing:  Constant Progression:  Worsening Chronicity:  New Context: not dietary changes, not medication, not narcotics and not stress   Stool description:  None produced Relieved by:  Nothing Ineffective treatments:  Laxatives and stool softeners Associated symptoms: abdominal pain   Associated symptoms: no anorexia, no diarrhea, no dysuria, no fever, no urinary retention and no vomiting   Risk factors comment:  Vaginal delivery about 1 month ago with episotomy and vageinal hematoma   Past Medical History  Diagnosis Date  . No pertinent past medical history   . Chlamydia June 2013  . Gonorrhea   . Infection     BV;may get frequently;currently has BV and meds rx'd  . Infection     UTI;not frequently    Past Surgical History  Procedure Laterality Date  . No past surgeries      Family History  Problem Relation Age of Onset  . Heart disease Father     deceased;died before pt was born    History  Substance Use Topics  . Smoking status: Former Smoker -- 1.00 packs/day for 6 years    Quit date: 11/28/2011  . Smokeless tobacco: Never Used  . Alcohol Use: No    OB History   Grav Para Term  Preterm Abortions TAB SAB Ect Mult Living   1 1 1       1       Review of Systems  Constitutional: Negative for fever.  Gastrointestinal: Positive for abdominal pain and constipation. Negative for vomiting, diarrhea and anorexia.  Genitourinary: Negative for dysuria.  All other systems reviewed and are negative.    Allergies  Review of patient's allergies indicates no known allergies.  Home Medications   Current Outpatient Rx  Name  Route  Sig  Dispense  Refill  . ferrous sulfate 325 (65 FE) MG tablet   Oral   Take 1 tablet (325 mg total) by mouth daily with breakfast.   30 tablet   3   . polyethylene glycol powder (GLYCOLAX/MIRALAX) powder      1 capful in 8 oz of liquid daily as needed to have 1-2 soft bm   255 g   0     BP 117/80  Pulse 99  Temp(Src) 98.3 F (36.8 C) (Oral)  Resp 18  SpO2 99%  Physical Exam  Nursing note and vitals reviewed. Constitutional: She is oriented to person, place, and time. She appears well-developed and well-nourished.  HENT:  Head: Normocephalic and atraumatic.  Right Ear: External ear normal.  Left Ear: External ear normal.  Mouth/Throat: Oropharynx is clear and moist.  Eyes: Conjunctivae and EOM are normal.  Neck: Normal range of motion. Neck supple.  Cardiovascular:  Normal rate, normal heart sounds and intact distal pulses.   Pulmonary/Chest: Effort normal and breath sounds normal.  Abdominal: Soft. Bowel sounds are normal. There is no tenderness. There is no rebound.  Genitourinary: Vagina normal. Guaiac negative stool.  Stool felt in rectal vault  Musculoskeletal: Normal range of motion.  Neurological: She is alert and oriented to person, place, and time.  Skin: Skin is warm.    ED Course  Procedures (including critical care time)  Labs Reviewed  URINALYSIS, ROUTINE W REFLEX MICROSCOPIC - Abnormal; Notable for the following:    APPearance CLOUDY (*)    Bilirubin Urine SMALL (*)    Ketones, ur 40 (*)     Leukocytes, UA MODERATE (*)    All other components within normal limits  URINE MICROSCOPIC-ADD ON - Abnormal; Notable for the following:    Squamous Epithelial / LPF MANY (*)    Bacteria, UA FEW (*)    All other components within normal limits  URINE CULTURE  POCT PREGNANCY, URINE   No results found.   1. Constipation       MDM  52 y with recent vaginal delivery about 1 month ago who presents for constipation.  No dysruia, no hematuria, no vaginal discharge, no hx of hemorrhoids, none noted on exam.  Will check ua, will check urine preg.   ua shows many bacteria, and only moderate le with only 7-10 wbc.  Will hold on treatment.  Will start on miralax for constipation.  Will have follow up with pcp/ob in 2 days as scheduled. Discussed signs that warrant reevaluation.         Chrystine Oiler, MD 08/20/12 1728

## 2012-08-20 NOTE — ED Notes (Signed)
Pt states that she is constipated and has tried laxative with stool softners but no results.  No abdominal pain, just feeling of needing to have BM.  Pt is complaining of rectal pain.  PT concerned it is making it hard for her to urinate.  No vaginal discharge or bleeding

## 2012-08-22 LAB — URINE CULTURE: Colony Count: >=100000

## 2012-08-23 NOTE — ED Notes (Signed)
Post ED Visit - Positive Culture Follow-up  Culture report reviewed by antimicrobial stewardship pharmacist: []  Wes Dulaney, Pharm.D., BCPS []  Celedonio Miyamoto, Pharm.D., BCPS [x]  Georgina Pillion, [address].D., BCPS []  [place], [place].D., BCPS, AAHIVP []  Estella Husk, Pharm.D., BCPS, AAHIVP  Positive Group B Strep culture  no further patient follow-up is required at this time.  Larena Sox 08/23/2012, 1:48 PM

## 2013-04-18 ENCOUNTER — Encounter (HOSPITAL_COMMUNITY): Payer: Self-pay | Admitting: *Deleted

## 2013-04-18 ENCOUNTER — Inpatient Hospital Stay (HOSPITAL_COMMUNITY)
Admission: AD | Admit: 2013-04-18 | Discharge: 2013-04-18 | Disposition: A | Discharge status: Home / Self Care | Payer: Medicaid Other | Source: Ambulatory Visit | Attending: Obstetrics & Gynecology | Admitting: Obstetrics & Gynecology

## 2013-04-18 ENCOUNTER — Inpatient Hospital Stay (HOSPITAL_COMMUNITY): Payer: Medicaid Other

## 2013-04-18 DIAGNOSIS — B9689 Other specified bacterial agents as the cause of diseases classified elsewhere: Secondary | ICD-10-CM | POA: Insufficient documentation

## 2013-04-18 DIAGNOSIS — N912 Amenorrhea, unspecified: Secondary | ICD-10-CM | POA: Insufficient documentation

## 2013-04-18 DIAGNOSIS — A499 Bacterial infection, unspecified: Secondary | ICD-10-CM | POA: Insufficient documentation

## 2013-04-18 DIAGNOSIS — N760 Acute vaginitis: Secondary | ICD-10-CM | POA: Insufficient documentation

## 2013-04-18 DIAGNOSIS — Z3201 Encounter for pregnancy test, result positive: Secondary | ICD-10-CM | POA: Insufficient documentation

## 2013-04-18 DIAGNOSIS — F17200 Nicotine dependence, unspecified, uncomplicated: Secondary | ICD-10-CM | POA: Insufficient documentation

## 2013-04-18 DIAGNOSIS — O98319 Other infections with a predominantly sexual mode of transmission complicating pregnancy, unspecified trimester: Secondary | ICD-10-CM

## 2013-04-18 DIAGNOSIS — O364XX0 Maternal care for intrauterine death, not applicable or unspecified: Secondary | ICD-10-CM

## 2013-04-18 HISTORY — DX: Personal history of other medical treatment: Z92.89

## 2013-04-18 HISTORY — DX: Other injury of unspecified body region, initial encounter: T14.8XXA

## 2013-04-18 HISTORY — DX: Severe pre-eclampsia, unspecified trimester: O14.10

## 2013-04-18 LAB — URINALYSIS, ROUTINE W REFLEX MICROSCOPIC
BILIRUBIN URINE: NEGATIVE
Glucose, UA: NEGATIVE mg/dL
Hgb urine dipstick: NEGATIVE
Ketones, ur: NEGATIVE mg/dL
Leukocytes, UA: NEGATIVE
NITRITE: NEGATIVE
PH: 6 (ref 5.0–8.0)
Protein, ur: NEGATIVE mg/dL
SPECIFIC GRAVITY, URINE: 1.025 (ref 1.005–1.030)
Urobilinogen, UA: 0.2 mg/dL (ref 0.0–1.0)

## 2013-04-18 LAB — CBC
HCT: 34.6 % — ABNORMAL LOW (ref 36.0–46.0)
HEMOGLOBIN: 12 g/dL (ref 12.0–15.0)
MCH: 29.3 pg (ref 26.0–34.0)
MCHC: 34.7 g/dL (ref 30.0–36.0)
MCV: 84.6 fL (ref 78.0–100.0)
Platelets: 237 10*3/uL (ref 150–400)
RBC: 4.09 MIL/uL (ref 3.87–5.11)
RDW: 13.2 % (ref 11.5–15.5)
WBC: 15.1 10*3/uL — ABNORMAL HIGH (ref 4.0–10.5)

## 2013-04-18 LAB — WET PREP, GENITAL
TRICH WET PREP: NONE SEEN
YEAST WET PREP: NONE SEEN

## 2013-04-18 LAB — GC/CHLAMYDIA PROBE AMP
CT Probe RNA: POSITIVE — AB
GC PROBE AMP APTIMA: NEGATIVE

## 2013-04-18 LAB — HCG, QUANTITATIVE, PREGNANCY: HCG, BETA CHAIN, QUANT, S: 10307 m[IU]/mL — AB (ref <5)

## 2013-04-18 LAB — POCT PREGNANCY, URINE: Preg Test, Ur: POSITIVE — AB

## 2013-04-18 MED ORDER — MISOPROSTOL 200 MCG PO TABS: ORAL_TABLET | ORAL | Status: DC

## 2013-04-18 MED ORDER — DEXTROSE 5 % IN LACTATED RINGERS IV BOLUS: 1000.0000 mL | Freq: Once | INTRAVENOUS | Status: DC

## 2013-04-18 MED ORDER — ONDANSETRON HCL 4 MG/2ML IJ SOLN: 4.0000 mg | INTRAMUSCULAR | Status: DC

## 2013-04-18 MED ORDER — PROMETHAZINE HCL 25 MG/ML IJ SOLN: 25.0000 mg | INTRAMUSCULAR | Status: DC

## 2013-04-18 NOTE — MAU Provider Note (Addendum)
Chief Complaint: Vaginal Bleeding   First Provider Initiated Contact with Patient 04/18/13 [phone]     SUBJECTIVE HPI: Hannah Nelson is a 23 y.o. G1P1001 at Unknown by LMP who presents to maternity admissions reporting no period since October with onset of cramping and spotting 1 week ago. She reports her periods are usually irregular, but not this far apart. She has a 83 month old and did not plan to become pregnant but reports she was not using contraception or STD prevention.  She denies vaginal itching/burning, urinary symptoms, h/a, dizziness, n/v, or fever/chills.     Past Medical History  Diagnosis Date  . No pertinent past medical history   . Chlamydia June 2013  . Gonorrhea   . Infection     BV;may get frequently;currently has BV and meds rx'd  . Infection     UTI;not frequently  . Preeclampsia, severe   . Hematoma     following delivery, blood trans. X 4  . History of blood transfusion     postpartum   Past Surgical History  Procedure Laterality Date  . No past surgeries     History   Social History  . Marital Status: Single    Spouse Name: N/A    Number of Children: N/A  . Years of Education: 12   Occupational History  . Lexicographer   Social History Main Topics  . Smoking status: Current Every Day Smoker -- 1.00 packs/day for 6 years    Types: Cigarettes  . Smokeless tobacco: Never Used  . Alcohol Use: No  . Drug Use: 7.00 per week    Special: Marijuana     Comment: Last used 1 month ago  . Sexual Activity: Yes    Partners: Male    Birth Control/ Protection: Condom, Pill, Injection, None     Comment: was on depo not using anythin except condoms "sometimes"   Other Topics Concern  . Not on file   Social History Narrative  . No narrative on file   No current facility-administered medications on file prior to encounter.   Current Outpatient Prescriptions on File Prior to Encounter  Medication Sig Dispense Refill  . ferrous sulfate 325  (65 FE) MG tablet Take 1 tablet (325 mg total) by mouth daily with breakfast.  30 tablet  3  . polyethylene glycol powder (GLYCOLAX/MIRALAX) powder 1 capful in 8 oz of liquid daily as needed to have 1-2 soft bm  255 g  0   No Known Allergies  ROS: Pertinent items in HPI  OBJECTIVE Blood pressure 120/73, pulse 106, temperature 98.7 F (37.1 C), temperature source Oral, resp. rate 18, height 5\' 4"  (1.626 m), weight 56.7 kg (125 lb), last menstrual period 01/01/2013. GENERAL: Well-developed, well-nourished female in no acute distress.  HEENT: Normocephalic HEART: normal rate RESP: normal effort ABDOMEN: Soft, non-tender EXTREMITIES: Nontender, no edema NEURO: Alert and oriented Pelvic exam: Cervix pink, visually closed, without lesion, moderate white/yellow creamy discharge, vaginal walls and external genitalia normal Bimanual exam: Cervix 0/long/high, firm, anterior, neg CMT, uterus nontender, nonenlarged, adnexa without tenderness, enlargement, or mass  LAB RESULTS Results for orders placed during the hospital encounter of 04/18/13 (from the past 24 hour(s))  URINALYSIS, ROUTINE W REFLEX MICROSCOPIC     Status: None   Collection Time    04/18/13  9:04 AM      Result Value Range   Color, Urine YELLOW  YELLOW   APPearance CLEAR  CLEAR   Specific Gravity, Urine 1.025  1.005 - 1.030   pH 6.0  5.0 - 8.0   Glucose, UA NEGATIVE  NEGATIVE mg/dL   Hgb urine dipstick NEGATIVE  NEGATIVE   Bilirubin Urine NEGATIVE  NEGATIVE   Ketones, ur NEGATIVE  NEGATIVE mg/dL   Protein, ur NEGATIVE  NEGATIVE mg/dL   Urobilinogen, UA 0.2  0.0 - 1.0 mg/dL   Nitrite NEGATIVE  NEGATIVE   Leukocytes, UA NEGATIVE  NEGATIVE  POCT PREGNANCY, URINE     Status: Abnormal   Collection Time    04/18/13  9:19 AM      Result Value Range   Preg Test, Ur POSITIVE (*) NEGATIVE  CBC     Status: Abnormal   Collection Time    04/18/13  9:30 AM      Result Value Range   WBC 15.1 (*) 4.0 - 10.5 K/uL   RBC 4.09  3.87 -  5.11 MIL/uL   Hemoglobin 12.0  12.0 - 15.0 g/dL   HCT 16.134.6 (*) 09.636.0 - 04.546.0 %   MCV 84.6  78.0 - 100.0 fL   MCH 29.3  26.0 - 34.0 pg   MCHC 34.7  30.0 - 36.0 g/dL   RDW 40.913.2  81.111.5 - 91.415.5 %   Platelets 237  150 - 400 K/uL    IMAGING No results found.  ASSESSMENT No diagnosis found.  PLAN Discharge home   Medication List    ASK your doctor about these medications       ferrous sulfate 325 (65 FE) MG tablet  Take 1 tablet (325 mg total) by mouth daily with breakfast.     polyethylene glycol powder powder  Commonly known as:  GLYCOLAX/MIRALAX  1 capful in 8 oz of liquid daily as needed to have 1-2 soft bm         Sharen CounterLisa Leftwich-Kirby Certified Nurse-Midwife 04/18/2013  10:01 AM   Pt seen and examined.  Pt has a 14 week fetal demise.  Pt has mild cramping and spotting currently.  Pt counseled and scheduled for D&E on Feb 2nd, Monday, at 1 pm.  Pt instructed to insert cytotec 400 mcg per vagina 4 hours prior to the procedure to soften the cervix.  Dr. Erin FullingHarraway-Smith will be doing the procedure. Pt would like depo provera on discharge after the procedure.

## 2013-04-18 NOTE — Discharge Instructions (Signed)
Dilation and Curettage or Vacuum Curettage Dilation and curettage (D&C) and vacuum curettage are minor procedures. A D&C involves stretching (dilation) the cervix and scraping (curettage) the inside lining of the womb (uterus). During a D&C, tissue is gently scraped from the inside lining of the uterus. During a vacuum curettage, the lining and tissue in the uterus are removed with the use of gentle suction.  Curettage may be performed to either diagnose or treat a problem. As a diagnostic procedure, curettage is performed to examine tissues from the uterus. A diagnostic curettage may be performed for the following symptoms:   Irregular bleeding in the uterus.   Bleeding with the development of clots.   Spotting between menstrual periods.   Prolonged menstrual periods.   Bleeding after menopause.   No menstrual period (amenorrhea).   A change in size and shape of the uterus.  As a treatment procedure, curettage may be performed for the following reasons:   Removal of an IUD (intrauterine device).   Removal of retained placenta after giving birth. Retained placenta can cause an infection or bleeding severe enough to require transfusions.   Abortion.   Miscarriage.   Removal of polyps inside the uterus.   Removal of uncommon types of noncancerous lumps (fibroids).  LET YOUR HEALTH CARE PROVIDER KNOW ABOUT:   Any allergies you have.   All medicines you are taking, including vitamins, herbs, eye drops, creams, and over-the-counter medicines.   Previous problems you or members of your family have had with the use of anesthetics.   Any blood disorders you have.   Previous surgeries you have had.   Medical conditions you have. RISKS AND COMPLICATIONS  Generally, this is a safe procedure. However, as with any procedure, complications can occur. Possible complications include:  Excessive bleeding.   Infection of the uterus.   Damage to the cervix.    Development of scar tissue (adhesions) inside the uterus, later causing abnormal amounts of menstrual bleeding.   Complications from the general anesthetic, if a general anesthetic is used.   Putting a hole (perforation) in the uterus. This is rare.  BEFORE THE PROCEDURE   Eat and drink before the procedure only as directed by your health care provider.   Arrange for someone to take you home.  PROCEDURE  This procedure usually takes about 15 30 minutes.  You will be given one of the following:  A medicine that numbs the area in and around the cervix (local anesthetic).   A medicine to make you sleep through the procedure (general anesthetic).  You will lie on your back with your legs in stirrups.   A warm metal or plastic instrument (speculum) will be placed in your vagina to keep it open and to allow the health care provider to see the cervix.  There are two ways in which your cervix can be softened and dilated. These include:   Taking a medicine.   Having thin rods (laminaria) inserted into your cervix.   A curved tool (curette) will be used to scrape cells from the inside lining of the uterus. In some cases, gentle suction is applied with the curette. The curette will then be removed.  AFTER THE PROCEDURE   You will rest in the recovery area until you are stable and are ready to go home.   You may feel sick to your stomach (nauseous) or throw up (vomit) if you were given a general anesthetic.   You may have a sore throat if a   tube was placed in your throat during general anesthesia.   You may have light cramping and bleeding. This may last for 2 days to 2 weeks after the procedure.   Your uterus needs to make a new lining after the procedure. This may make your next period late. Document Released: 03/06/2005 Document Revised: 11/06/2012 Document Reviewed: 10/03/2012 [employer] Patient Information 2014 [employer].  

## 2013-04-18 NOTE — MAU Note (Signed)
Having small amounts of spotting/bleeding off and on X 1 week. No "real" period since October. Cramping in lower abdomen.

## 2013-04-21 ENCOUNTER — Ambulatory Visit (HOSPITAL_COMMUNITY): Payer: Medicaid Other | Admitting: Anesthesiology

## 2013-04-21 ENCOUNTER — Encounter (HOSPITAL_COMMUNITY)
Admission: RE | Disposition: A | Discharge status: Home / Self Care | Payer: Self-pay | Source: Ambulatory Visit | Attending: Obstetrics & Gynecology

## 2013-04-21 ENCOUNTER — Encounter (HOSPITAL_COMMUNITY): Payer: Medicaid Other | Admitting: Anesthesiology

## 2013-04-21 ENCOUNTER — Encounter (HOSPITAL_COMMUNITY)
Admission: AD | Disposition: A | Discharge status: Home / Self Care | Payer: Self-pay | Source: Ambulatory Visit | Attending: Obstetrics & Gynecology

## 2013-04-21 ENCOUNTER — Ambulatory Visit (HOSPITAL_COMMUNITY)
Admission: RE | Admit: 2013-04-21 | Discharge: 2013-04-21 | Disposition: A | Discharge status: Home / Self Care | Payer: Medicaid Other | Source: Ambulatory Visit | Attending: Obstetrics & Gynecology | Admitting: Obstetrics & Gynecology

## 2013-04-21 ENCOUNTER — Encounter (HOSPITAL_COMMUNITY): Payer: Self-pay | Admitting: Anesthesiology

## 2013-04-21 DIAGNOSIS — F17200 Nicotine dependence, unspecified, uncomplicated: Secondary | ICD-10-CM | POA: Insufficient documentation

## 2013-04-21 DIAGNOSIS — I10 Essential (primary) hypertension: Secondary | ICD-10-CM | POA: Insufficient documentation

## 2013-04-21 DIAGNOSIS — IMO0002 Reserved for concepts with insufficient information to code with codable children: Secondary | ICD-10-CM | POA: Insufficient documentation

## 2013-04-21 DIAGNOSIS — Z202 Contact with and (suspected) exposure to infections with a predominantly sexual mode of transmission: Secondary | ICD-10-CM

## 2013-04-21 DIAGNOSIS — O021 Missed abortion: Secondary | ICD-10-CM | POA: Insufficient documentation

## 2013-04-21 HISTORY — PX: DILATION AND EVACUATION: SHX1459

## 2013-04-21 LAB — TYPE AND SCREEN
ABO/RH(D): B POS
ANTIBODY SCREEN: NEGATIVE

## 2013-04-21 SURGERY — DILATION AND EVACUATION, UTERUS
Anesthesia: Choice

## 2013-04-21 SURGERY — DILATION AND EVACUATION, UTERUS
Anesthesia: General | Site: Vagina

## 2013-04-21 MED ORDER — PROMETHAZINE HCL 25 MG/ML IJ SOLN: 6.2500 mg | INTRAMUSCULAR | Status: DC | PRN

## 2013-04-21 MED ORDER — SUCCINYLCHOLINE CHLORIDE 20 MG/ML IJ SOLN
INTRAMUSCULAR | Status: DC | PRN
  Administered 2013-04-21: 100 mg via INTRAVENOUS

## 2013-04-21 MED ORDER — GLYCOPYRROLATE 0.2 MG/ML IJ SOLN
INTRAMUSCULAR | Status: DC | PRN
  Administered 2013-04-21: 0.1 mg via INTRAVENOUS

## 2013-04-21 MED ORDER — METHYLERGONOVINE MALEATE 0.2 MG/ML IJ SOLN
INTRAMUSCULAR | Status: DC | PRN
  Administered 2013-04-21: 0.2 mg via INTRAMUSCULAR

## 2013-04-21 MED ORDER — ONDANSETRON HCL 4 MG/2ML IJ SOLN
INTRAMUSCULAR | Status: DC | PRN
  Administered 2013-04-21: 4 mg via INTRAVENOUS

## 2013-04-21 MED ORDER — MEPERIDINE HCL 25 MG/ML IJ SOLN: 6.2500 mg | INTRAMUSCULAR | Status: DC | PRN

## 2013-04-21 MED ORDER — KETOROLAC TROMETHAMINE 30 MG/ML IJ SOLN: 15.0000 mg | Freq: Once | INTRAMUSCULAR | Status: DC | PRN

## 2013-04-21 MED ORDER — DEXAMETHASONE SODIUM PHOSPHATE 10 MG/ML IJ SOLN
INTRAMUSCULAR | Status: DC | PRN
  Administered 2013-04-21: 10 mg via INTRAVENOUS

## 2013-04-21 MED ORDER — BUPIVACAINE HCL (PF) 0.5 % IJ SOLN
INTRAMUSCULAR | Status: DC | PRN
  Administered 2013-04-21: 15 mL

## 2013-04-21 MED ORDER — FENTANYL CITRATE 0.05 MG/ML IJ SOLN
INTRAMUSCULAR | Status: AC
  Administered 2013-04-21: 25 ug via INTRAVENOUS
  Filled 2013-04-21: qty 2

## 2013-04-21 MED ORDER — IBUPROFEN 600 MG PO TABS: 600.0000 mg | ORAL_TABLET | Freq: Four times a day (QID) | ORAL | Status: DC | PRN

## 2013-04-21 MED ORDER — PHENYLEPHRINE HCL 10 MG/ML IJ SOLN
INTRAMUSCULAR | Status: DC | PRN
  Administered 2013-04-21 (×2): 80 ug via INTRAVENOUS

## 2013-04-21 MED ORDER — MIDAZOLAM HCL 2 MG/2ML IJ SOLN
INTRAMUSCULAR | Status: AC
  Filled 2013-04-21: qty 2

## 2013-04-21 MED ORDER — ROCURONIUM BROMIDE 100 MG/10ML IV SOLN
INTRAVENOUS | Status: DC | PRN
  Administered 2013-04-21: 5 mg via INTRAVENOUS

## 2013-04-21 MED ORDER — PROPOFOL 10 MG/ML IV EMUL
INTRAVENOUS | Status: AC
  Filled 2013-04-21: qty 20

## 2013-04-21 MED ORDER — KETOROLAC TROMETHAMINE 30 MG/ML IJ SOLN
INTRAMUSCULAR | Status: AC
  Filled 2013-04-21: qty 1

## 2013-04-21 MED ORDER — AZITHROMYCIN 500 MG IV SOLR
500.0000 mg | Freq: Once | INTRAVENOUS | Status: AC
  Administered 2013-04-21: 500 mg via INTRAVENOUS
  Filled 2013-04-21: qty 500

## 2013-04-21 MED ORDER — FENTANYL CITRATE 0.05 MG/ML IJ SOLN
INTRAMUSCULAR | Status: DC | PRN
Start: 2013-04-21 — End: 2013-04-21
  Administered 2013-04-21 (×2): 100 ug via INTRAVENOUS
  Administered 2013-04-21: 50 ug via INTRAVENOUS

## 2013-04-21 MED ORDER — DEXAMETHASONE SODIUM PHOSPHATE 10 MG/ML IJ SOLN
INTRAMUSCULAR | Status: AC
  Filled 2013-04-21: qty 1

## 2013-04-21 MED ORDER — FENTANYL CITRATE 0.05 MG/ML IJ SOLN
INTRAMUSCULAR | Status: AC
  Filled 2013-04-21: qty 5

## 2013-04-21 MED ORDER — LACTATED RINGERS IV SOLN
INTRAVENOUS | Status: DC
Start: 2013-04-21 — End: 2013-04-21

## 2013-04-21 MED ORDER — BUPIVACAINE HCL (PF) 0.25 % IJ SOLN
INTRAMUSCULAR | Status: AC
  Filled 2013-04-21: qty 20

## 2013-04-21 MED ORDER — LACTATED RINGERS IV SOLN
INTRAVENOUS | Status: DC
  Administered 2013-04-21 (×2): via INTRAVENOUS

## 2013-04-21 MED ORDER — MIDAZOLAM HCL 2 MG/2ML IJ SOLN
INTRAMUSCULAR | Status: DC | PRN
  Administered 2013-04-21: 2 mg via INTRAVENOUS

## 2013-04-21 MED ORDER — KETOROLAC TROMETHAMINE 30 MG/ML IJ SOLN
INTRAMUSCULAR | Status: DC | PRN
  Administered 2013-04-21: 30 mg via INTRAVENOUS

## 2013-04-21 MED ORDER — GLYCOPYRROLATE 0.2 MG/ML IJ SOLN
INTRAMUSCULAR | Status: AC
  Filled 2013-04-21: qty 1

## 2013-04-21 MED ORDER — PHENYLEPHRINE 40 MCG/ML (10ML) SYRINGE FOR IV PUSH (FOR BLOOD PRESSURE SUPPORT)
PREFILLED_SYRINGE | INTRAVENOUS | Status: AC
  Filled 2013-04-21: qty 5

## 2013-04-21 MED ORDER — PROPOFOL 10 MG/ML IV BOLUS
INTRAVENOUS | Status: DC | PRN
  Administered 2013-04-21: 200 mg via INTRAVENOUS

## 2013-04-21 MED ORDER — ONDANSETRON HCL 4 MG/2ML IJ SOLN
INTRAMUSCULAR | Status: AC
  Filled 2013-04-21: qty 2

## 2013-04-21 MED ORDER — LIDOCAINE HCL (CARDIAC) 20 MG/ML IV SOLN
INTRAVENOUS | Status: DC | PRN
  Administered 2013-04-21: 70 mg via INTRAVENOUS

## 2013-04-21 MED ORDER — AZITHROMYCIN 250 MG PO TABS: 1000.0000 mg | ORAL_TABLET | Freq: Once | ORAL | Status: DC

## 2013-04-21 MED ORDER — MIDAZOLAM HCL 2 MG/2ML IJ SOLN: 0.5000 mg | Freq: Once | INTRAMUSCULAR | Status: DC | PRN

## 2013-04-21 MED ORDER — DOXYCYCLINE HYCLATE 100 MG PO CAPS: 100.0000 mg | ORAL_CAPSULE | Freq: Two times a day (BID) | ORAL | Status: DC

## 2013-04-21 MED ORDER — FENTANYL CITRATE 0.05 MG/ML IJ SOLN
25.0000 ug | INTRAMUSCULAR | Status: DC | PRN
  Administered 2013-04-21 (×4): 25 ug via INTRAVENOUS

## 2013-04-21 MED ORDER — LIDOCAINE HCL (CARDIAC) 20 MG/ML IV SOLN
INTRAVENOUS | Status: AC
  Filled 2013-04-21: qty 5

## 2013-04-21 SURGICAL SUPPLY — 24 items
ADAPTER VACURETTE TBG SET 14 (CANNULA) ×2 IMPLANT
CATH ROBINSON RED A/P 16FR (CATHETERS) ×2 IMPLANT
CLOTH BEACON ORANGE TIMEOUT ST (SAFETY) ×2 IMPLANT
DECANTER SPIKE VIAL GLASS SM (MISCELLANEOUS) ×2 IMPLANT
GLOVE BIOGEL PI IND STRL 7.0 (GLOVE) ×1 IMPLANT
GLOVE BIOGEL PI INDICATOR 7.0 (GLOVE) ×1
GLOVE ECLIPSE 7.0 STRL STRAW (GLOVE) ×4 IMPLANT
GOWN STRL REUS W/ TWL XL LVL3 (GOWN DISPOSABLE) ×1 IMPLANT
GOWN STRL REUS W/TWL LRG LVL3 (GOWN DISPOSABLE) ×2 IMPLANT
GOWN STRL REUS W/TWL XL LVL3 (GOWN DISPOSABLE) ×1
KIT BERKELEY 1ST TRIMESTER 3/8 (MISCELLANEOUS) ×2 IMPLANT
NEEDLE SPNL 22GX3.5 QUINCKE BK (NEEDLE) ×2 IMPLANT
NS IRRIG 1000ML POUR BTL (IV SOLUTION) ×2 IMPLANT
PACK VAGINAL MINOR WOMEN LF (CUSTOM PROCEDURE TRAY) ×2 IMPLANT
PAD OB MATERNITY 4.3X12.25 (PERSONAL CARE ITEMS) ×2 IMPLANT
PAD PREP 24X48 CUFFED NSTRL (MISCELLANEOUS) ×2 IMPLANT
SET BERKELEY SUCTION TUBING (SUCTIONS) ×2 IMPLANT
SYR CONTROL 10ML LL (SYRINGE) ×2 IMPLANT
TOWEL OR 17X24 6PK STRL BLUE (TOWEL DISPOSABLE) ×4 IMPLANT
TUBE VACURETTE 2ND TRIMESTER (CANNULA) ×2 IMPLANT
VACURETTE 10 RIGID CVD (CANNULA) IMPLANT
VACURETTE 7MM CVD STRL WRAP (CANNULA) IMPLANT
VACURETTE 8 RIGID CVD (CANNULA) IMPLANT
VACURETTE 9 RIGID CVD (CANNULA) IMPLANT

## 2013-04-21 NOTE — Anesthesia Procedure Notes (Signed)
Procedure Name: Intubation Date/Time: 04/21/2013 1:35 PM Performed by: Graciela HusbandsFUSSELL, Rossana Molchan O Pre-anesthesia Checklist: Patient identified, Patient being monitored, Timeout performed, Emergency Drugs available and Suction available Patient Re-evaluated:Patient Re-evaluated prior to inductionOxygen Delivery Method: Circle system utilized Preoxygenation: Pre-oxygenation with 100% oxygen Intubation Type: IV induction, Rapid sequence and Cricoid Pressure applied Laryngoscope Size: Mac and 3 Grade View: Grade I Laser Tube: Cuffed inflated with minimal occlusive pressure - saline Tube size: 7.0 mm Number of attempts: 1 Airway Equipment and Method: Stylet Secured at: 21 cm Tube secured with: Tape Dental Injury: Teeth and Oropharynx as per pre-operative assessment

## 2013-04-21 NOTE — H&P (Signed)
  No change from last visit.  Chlamydia +.  Treated with Zithromax in preop.  Davidlee Jeanbaptiste L. Harraway-Smith, M.D., Evern CoreFACOG

## 2013-04-21 NOTE — Transfer of Care (Signed)
Immediate Anesthesia Transfer of Care Note  Patient: Hannah Nelson  Procedure(s) Performed: Procedure(s): DILATATION AND EVACUATION (N/A)  Patient Location: PACU  Anesthesia Type:General  Level of Consciousness: awake, alert  and oriented  Airway & Oxygen Therapy: Patient Spontanous Breathing and Patient connected to nasal cannula oxygen  Post-op Assessment: Report given to PACU RN and Post -op Vital signs reviewed and stable  Post vital signs: Reviewed and stable  Complications: No apparent anesthesia complications

## 2013-04-21 NOTE — Op Note (Signed)
Hannah PoliteLatoya Nelson PROCEDURE DATE: 04/21/2013   PRE-OPERATIVE DIAGNOSIS:  Missed Abortion, 14 weeks  POST-OPERATIVE DIAGNOSIS:  missed abortion  PROCEDURE:  Procedure(s): DILATATION AND EVACUATION (N/A)  SURGEON:  Surgeon(s) and Role:    * Willodean Rosenthalarolyn Harraway-Smith, MD - Primary  ANESTHESIA:   general  EBL:  Total I/O In: 1700 [I.V.:1700] Out: 400 [Urine:100; Blood:300]  BLOOD ADMINISTERED:none  DRAINS: none   LOCAL MEDICATIONS USED:  MARCAINE     SPECIMEN:  Source of Specimen:  products of conception  DISPOSITION OF SPECIMEN:  PATHOLOGY  COUNTS:  YES  TOURNIQUET:  * No tourniquets in log *  DICTATION: .Note written in EPIC  PLAN OF CARE: Discharge to home after PACU  PATIENT DISPOSITION:  PACU - hemodynamically stable.   Delay start of Pharmacological VTE agent (>24hrs) due to surgical blood loss or risk of bleeding: not applicable  INDICATIONS: 23 y.o. G2P1001 with IUFD at [redacted] weeks gestation, needing surgical completion.  Risks of surgery were discussed with the patient including but not limited to: bleeding which may require transfusion; infection which may require antibiotics; injury to uterus or surrounding organs; need for additional procedures including laparotomy or laparoscopy; possibility of intrauterine scarring which may impair future fertility; and other postoperative/anesthesia complications. Written informed consent was obtained.    FINDINGS:  A 14 week size uterus, moderate amounts of products of conception, specimen sent to pathology.  COMPLICATIONS:  None immediate.  PROCEDURE DETAILS:  The patient received intravenous Zithromax while in the preoperative area.  She was then taken to the operating room where monitored intravenous sedation was administered and was found to be adequate.  After an adequate timeout was performed, she was placed in the dorsal lithotomy position and examined; then prepped and draped in the sterile manner.   Her bladder was catheterized  for an unmeasured amount of clear, yellow urine. A vaginal speculum was then placed in the patient's vagina and a single tooth tenaculum was applied to the anterior lip of the cervix.  A paracervical block using 30 ml of 0.5% Marcaine was administered. The cervix was gently dilated to accommodate a 14 mm curved suction curette that was gently advanced to the uterine fundus.  The suction device was then activated and curette slowly rotated to clear the uterus of products of conception.  A R[place]stone forceps was used to remove large POC.  A sharp curettage was then performed to confirm complete emptying of the uterus. There was minimal bleeding noted and the tenaculum removed with good hemostasis noted.   All instruments were removed from the patient's vagina. The patient tolerated the procedure well and was taken to the recovery area awake, and in stable condition.  The patient will be discharged to home as per PACU criteria.  Routine postoperative instructions given.  She was prescribed Ibuprofen and  Doxycycline.  She will follow up in the clinic in 2 weeks for postoperative evaluation.

## 2013-04-21 NOTE — Anesthesia Postprocedure Evaluation (Signed)
  Anesthesia Post-op Note  Anesthesia Post Note  Patient: Hannah Nelson  Procedure(s) Performed: Procedure(s) (LRB): DILATATION AND EVACUATION (N/A)  Anesthesia type: General  Patient location: PACU  Post pain: Pain level controlled  Post assessment: Post-op Vital signs reviewed  Last Vitals:  Filed Vitals:   04/21/13 1545  BP: 106/56  Pulse: 59  Temp:   Resp: 16    Post vital signs: Reviewed  Level of consciousness: sedated  Complications: No apparent anesthesia complications

## 2013-04-21 NOTE — Brief Op Note (Signed)
04/21/2013  3:13 PM  PATIENT:  Hannah PoliteLatoya Arauz  23 y.o. female  PRE-OPERATIVE DIAGNOSIS:  Missed Abortion, 14 weeks  POST-OPERATIVE DIAGNOSIS:  missed abortion  PROCEDURE:  Procedure(s): DILATATION AND EVACUATION (N/A)  SURGEON:  Surgeon(s) and Role:    * Willodean Rosenthalarolyn Harraway-Smith, MD - Primary  ANESTHESIA:   general  EBL:  Total I/O In: 1700 [I.V.:1700] Out: 400 [Urine:100; Blood:300]  BLOOD ADMINISTERED:none  DRAINS: none   LOCAL MEDICATIONS USED:  MARCAINE     SPECIMEN:  Source of Specimen:  products of conception  DISPOSITION OF SPECIMEN:  PATHOLOGY  COUNTS:  YES  TOURNIQUET:  * No tourniquets in log *  DICTATION: .Note written in EPIC  PLAN OF CARE: Discharge to home after PACU  PATIENT DISPOSITION:  PACU - hemodynamically stable.   Delay start of Pharmacological VTE agent (>24hrs) due to surgical blood loss or risk of bleeding: not applicable

## 2013-04-21 NOTE — Discharge Instructions (Signed)
DISCHARGE INSTRUCTIONS: D&C / D&E The following instructions have been prepared to help you care for yourself upon your return home.   **You may take Ibuprofen/Motrin/Aleve after 7:36 pm tonight**  Personal hygiene:  Use sanitary pads for vaginal drainage, not tampons.  Shower the day after your procedure.  NO tub baths, pools or Jacuzzis for 2-3 weeks.  Wipe front to back after using the bathroom.  Activity and limitations:  Do NOT drive or operate any equipment for 24 hours. The effects of anesthesia are still present and drowsiness may result.  Do NOT rest in bed all day.  Walking is encouraged.  Walk up and down stairs slowly.  You may resume your normal activity in one to two days or as indicated by your physician.  Sexual activity: NO intercourse for at least 2 weeks after the procedure, or as indicated by your physician.  Diet: Eat a light meal as desired this evening. You may resume your usual diet tomorrow.  Return to work: You may resume your work activities in one to two days or as indicated by your doctor.  What to expect after your surgery: Expect to have vaginal bleeding/discharge for 2-3 days and spotting for up to 10 days. It is not unusual to have soreness for up to 1-2 weeks. You may have a slight burning sensation when you urinate for the first day. Mild cramps may continue for a couple of days. You may have a regular period in 2-6 weeks.  Call your doctor for any of the following:  Excessive vaginal bleeding, saturating and changing one pad every hour.  Inability to urinate 6 hours after discharge from hospital.  Pain not relieved by pain medication.  Fever of 100.4 F or greater.  Unusual vaginal discharge or odor.   Call for an appointment:    Patients signature: ______________________  Nurses signature ________________________  Support person's signature_______________________

## 2013-04-21 NOTE — Anesthesia Preprocedure Evaluation (Addendum)
Anesthesia Evaluation  Patient identified by MRN, date of birth, ID band Patient awake    Reviewed: Allergy & Precautions, H&P , Patient's Chart, lab work & pertinent test results, reviewed documented beta blocker date and time   History of Anesthesia Complications Negative for: history of anesthetic complications  Airway Mallampati: II TM Distance: >3 FB Neck ROM: full    Dental   Pulmonary Current Smoker,  breath sounds clear to auscultation        Cardiovascular Exercise Tolerance: Good hypertension, Rhythm:regular Rate:Normal     Neuro/Psych negative psych ROS   GI/Hepatic   Endo/Other    Renal/GU      Musculoskeletal   Abdominal   Peds  Hematology   Anesthesia Other Findings   Reproductive/Obstetrics                          Anesthesia Physical Anesthesia Plan  ASA: II  Anesthesia Plan: General ETT   Post-op Pain Management:    Induction: Cricoid pressure planned, Rapid sequence and Intravenous  Airway Management Planned: Oral ETT  Additional Equipment:   Intra-op Plan:   Post-operative Plan:   Informed Consent: I have reviewed the patients History and Physical, chart, labs and discussed the procedure including the risks, benefits and alternatives for the proposed anesthesia with the patient or authorized representative who has indicated his/her understanding and acceptance.   Dental Advisory Given  Plan Discussed with: CRNA, Surgeon and Anesthesiologist  Anesthesia Plan Comments:        Anesthesia Quick Evaluation

## 2013-04-22 ENCOUNTER — Encounter (HOSPITAL_COMMUNITY): Payer: Self-pay | Admitting: Obstetrics & Gynecology

## 2013-04-23 ENCOUNTER — Telehealth: Payer: Self-pay

## 2013-04-23 NOTE — Telephone Encounter (Signed)
Attempted to call pt. Heard "per the prescriber's request this phone does not receive incoming calls." Will send letter with information of appointment.

## 2013-04-23 NOTE — Telephone Encounter (Signed)
Message copied by Louanna RawAMPBELL, Tillman Kazmierski M on Wed Apr 23, 2013  4:15 PM ------      Message from: Odelia GageLINTON, CHERYL A      Created: Wed Apr 23, 2013  4:10 PM       Appointment is 02/23 @ 1:00                  ----- Message -----         From: Drucilla Schmidtiane L Day, RN         Sent: 04/22/2013  11:09 AM           To: [facility] Admin Pool            Please schedule appt and return to Clinical Pool.  Thanks       ----- Message -----         From: Willodean Rosenthalarolyn Harraway-Smith, MD         Sent: 04/21/2013   3:28 PM           To: [facility] Clinical Pool            Please schedule pt 2 week post op  f/u appt with Depo Provera.            Thx,      clh-S             ------

## 2013-05-07 NOTE — H&P (Signed)
SUBJECTIVE  HPI: Hannah Nelson is a 23 y.o. G1P1001 at Unknown by LMP who presents to maternity admissions reporting no period since October with onset of cramping and spotting 1 week ago. She reports her periods are usually irregular, but not this far apart. She has a 23 month old and did not plan to become pregnant but reports she was not using contraception or STD prevention. She denies vaginal itching/burning, urinary symptoms, h/a, dizziness, n/v, or fever/chills.  Past Medical History   Diagnosis  Date   .  No pertinent past medical history    .  Chlamydia  June 2013   .  Gonorrhea    .  Infection      BV;may get frequently;currently has BV and meds rx'd   .  Infection      UTI;not frequently   .  Preeclampsia, severe    .  Hematoma      following delivery, blood trans. X 4   .  History of blood transfusion      postpartum    Past Surgical History   Procedure  Laterality  Date   .  No past surgeries      History    Social History   .  Marital Status:  Single     Spouse Name:  N/A     Number of Children:  N/A   .  Years of Education:  12    Occupational History   .  Dealer    Social History Main Topics   .  Smoking status:  Current Every Day Smoker -- 1.00 packs/day for 6 years     Types:  Cigarettes   .  Smokeless tobacco:  Never Used   .  Alcohol Use:  No   .  Drug Use:  7.00 per week     Special:  Marijuana      Comment: Last used 1 month ago   .  Sexual Activity:  Yes     Partners:  Male     Birth Control/ Protection:  Condom, Pill, Injection, None      Comment: was on depo not using anythin except condoms "sometimes"    Other Topics  Concern   .  Not on file    Social History Narrative   .  No narrative on file    No current facility-administered medications on file prior to encounter.    Current Outpatient Prescriptions on File Prior to Encounter   Medication  Sig  Dispense  Refill   .  ferrous sulfate 325 (65 FE) MG tablet   Take 1 tablet (325 mg total) by mouth daily with breakfast.  30 tablet  3   .  polyethylene glycol powder (GLYCOLAX/MIRALAX) powder  1 capful in 8 oz of liquid daily as needed to have 1-2 soft bm  255 g  0   No Known Allergies  ROS: Pertinent items in HPI  OBJECTIVE  Blood pressure 120/73, pulse 106, temperature 98.7 F (37.1 C), temperature source Oral, resp. rate 18, height 5\' 4"  (1.626 m), weight 56.7 kg (125 lb), last menstrual period 01/01/2013.  GENERAL: Well-developed, well-nourished female in no acute distress.  HEENT: Normocephalic  HEART: normal rate  RESP: normal effort  ABDOMEN: Soft, non-tender  EXTREMITIES: Nontender, no edema  NEURO: Alert and oriented  Pelvic exam: Cervix pink, visually closed, without lesion, moderate white/yellow creamy discharge, vaginal walls and external genitalia normal  Bimanual exam: Cervix 0/long/high, firm, anterior, neg CMT,  uterus nontender, nonenlarged, adnexa without tenderness, enlargement, or mass  LAB RESULTS  Results for orders placed during the hospital encounter of 04/18/13 (from the past 24 hour(s))   URINALYSIS, ROUTINE W REFLEX MICROSCOPIC Status: None    Collection Time    04/18/13 9:04 AM   Result  Value  Range    Color, Urine  YELLOW  YELLOW    APPearance  CLEAR  CLEAR    Specific Gravity, Urine  1.025  1.005 - 1.030    pH  6.0  5.0 - 8.0    Glucose, UA  NEGATIVE  NEGATIVE mg/dL    Hgb urine dipstick  NEGATIVE  NEGATIVE    Bilirubin Urine  NEGATIVE  NEGATIVE    Ketones, ur  NEGATIVE  NEGATIVE mg/dL    Protein, ur  NEGATIVE  NEGATIVE mg/dL    Urobilinogen, UA  0.2  0.0 - 1.0 mg/dL    Nitrite  NEGATIVE  NEGATIVE    Leukocytes, UA  NEGATIVE  NEGATIVE   POCT PREGNANCY, URINE Status: Abnormal    Collection Time    04/18/13 9:19 AM   Result  Value  Range    Preg Test, Ur  POSITIVE (*)  NEGATIVE   CBC Status: Abnormal    Collection Time    04/18/13 9:30 AM   Result  Value  Range    WBC  15.1 (*)  4.0 - 10.5 K/uL    RBC   4.09  3.87 - 5.11 MIL/uL    Hemoglobin  12.0  12.0 - 15.0 g/dL    HCT  16.1 (*)  09.6 - 46.0 %    MCV  84.6  78.0 - 100.0 fL    MCH  29.3  26.0 - 34.0 pg    MCHC  34.7  30.0 - 36.0 g/dL    RDW  04.5  40.9 - 81.1 %    Platelets  237  150 - 400 K/uL   IMAGING  No results found.  ASSESSMENT  No diagnosis found.  PLAN  Discharge home    Medication List     ASK your doctor about these medications       ferrous sulfate 325 (65 FE) MG tablet    Take 1 tablet (325 mg total) by mouth daily with breakfast.    polyethylene glycol powder powder    Commonly known as: GLYCOLAX/MIRALAX    1 capful in 8 oz of liquid daily as needed to have 1-2 soft bm     Sharen Counter  Certified Nurse-Midwife  04/18/2013  10:01 AM  Pt seen and examined. Pt has a 14 week fetal demise. Patient desires surgical management with dilation and evacuation for 14 week demise.   The risks of surgery were discussed in detail with the patient including but not limited to: bleeding which may require transfusion or reoperation; infection which may require prolonged hospitalization or re-hospitalization and antibiotic therapy; injury to bowel, bladder, ureters and major vessels or other surrounding organs; need for additional procedures including laparotomy; thromboembolic phenomenon, incisional problems and other postoperative or anesthesia complications.  Patient was told that the likelihood that her condition and symptoms will be treated effectively with this surgical management was very high; the postoperative expectations were also discussed in detail. The patient also understands the alternative treatment options which were discussed in full. All questions were answered.  She was told that she will be contacted by our surgical scheduler regarding the time and date of her surgery; routine preoperative instructions of  having nothing to eat or drink after midnight on the day prior to surgery and also coming to the hospital 1  1/2 hours prior to her time of surgery were also emphasized.  She was told she may be called for a preoperative appointment about a week prior to surgery and will be given further preoperative instructions at that visit. Printed patient education handouts about the procedure were given to the patient to review at home.

## 2013-05-12 ENCOUNTER — Ambulatory Visit: Payer: Medicaid Other | Admitting: Obstetrics & Gynecology

## 2013-05-12 NOTE — Progress Notes (Signed)
Attempted to contact pt to verify reasoning for not coming to her appt today.  Unable to contact patient with telephone # in the system due to message stating "this caller does not except incoming calls".

## 2014-01-19 ENCOUNTER — Encounter (HOSPITAL_COMMUNITY): Payer: Self-pay | Admitting: Obstetrics & Gynecology

## 2014-02-21 ENCOUNTER — Encounter (HOSPITAL_COMMUNITY): Payer: Self-pay | Admitting: *Deleted

## 2015-09-22 ENCOUNTER — Encounter (HOSPITAL_COMMUNITY): Payer: Self-pay | Admitting: Emergency Medicine

## 2015-09-22 ENCOUNTER — Emergency Department (HOSPITAL_COMMUNITY)
Admission: EM | Admit: 2015-09-22 | Discharge: 2015-09-22 | Disposition: A | Discharge status: Home / Self Care | Payer: Medicaid Other | Attending: Emergency Medicine | Admitting: Emergency Medicine

## 2015-09-22 DIAGNOSIS — B9689 Other specified bacterial agents as the cause of diseases classified elsewhere: Secondary | ICD-10-CM

## 2015-09-22 DIAGNOSIS — N760 Acute vaginitis: Secondary | ICD-10-CM | POA: Insufficient documentation

## 2015-09-22 DIAGNOSIS — Z711 Person with feared health complaint in whom no diagnosis is made: Secondary | ICD-10-CM

## 2015-09-22 DIAGNOSIS — F17210 Nicotine dependence, cigarettes, uncomplicated: Secondary | ICD-10-CM | POA: Insufficient documentation

## 2015-09-22 LAB — I-STAT BETA HCG BLOOD, ED (MC, WL, AP ONLY): I-stat hCG, quantitative: <5 m[IU]/mL (ref <5)

## 2015-09-22 LAB — URINALYSIS, ROUTINE W REFLEX MICROSCOPIC
Bilirubin Urine: NEGATIVE
GLUCOSE, UA: NEGATIVE mg/dL
KETONES UR: NEGATIVE mg/dL
Nitrite: NEGATIVE
PROTEIN: NEGATIVE mg/dL
Specific Gravity, Urine: 1.026 (ref 1.005–1.030)
pH: 6.5 (ref 5.0–8.0)

## 2015-09-22 LAB — WET PREP, GENITAL
Sperm: NONE SEEN
TRICH WET PREP: NONE SEEN
YEAST WET PREP: NONE SEEN

## 2015-09-22 LAB — URINE MICROSCOPIC-ADD ON

## 2015-09-22 MED ORDER — METRONIDAZOLE 500 MG PO TABS: 500.0000 mg | ORAL_TABLET | Freq: Two times a day (BID) | ORAL | Status: DC

## 2015-09-22 MED ORDER — AZITHROMYCIN 250 MG PO TABS
1000.0000 mg | ORAL_TABLET | Freq: Once | ORAL | Status: AC
  Administered 2015-09-22: 1000 mg via ORAL
  Filled 2015-09-22: qty 4

## 2015-09-22 MED ORDER — CEFTRIAXONE SODIUM 250 MG IJ SOLR
250.0000 mg | Freq: Once | INTRAMUSCULAR | Status: AC
  Administered 2015-09-22: 250 mg via INTRAMUSCULAR
  Filled 2015-09-22: qty 250

## 2015-09-22 MED ORDER — LIDOCAINE HCL 1 % IJ SOLN
INTRAMUSCULAR | Status: AC
  Administered 2015-09-22: 0.9 mL
  Filled 2015-09-22: qty 20

## 2015-09-22 NOTE — Discharge Instructions (Signed)
You were treated for exposure to sexually transmitted disease. You are also treated for bacterial vaginosis. Please take all of your antibiotics as prescribed. Follow-up with your doctor as needed. Is also important to follow-up with OB/GYN for further evaluation of the spots on your cervix as we discussed. Return to ED for any new or worsening symptoms.  Sexually Transmitted Disease A sexually transmitted disease (STD) is a disease or infection often passed to another person during sex. However, STDs can be passed through nonsexual ways. An STD can be passed through:  Spit (saliva).  Semen.  Blood.  Mucus from the vagina.  Pee (urine). HOW CAN I LESSEN MY CHANCES OF GETTING AN STD?  Use:  Latex condoms.  Water-soluble lubricants with condoms. Do not use petroleum jelly or oils.  Dental dams. These are small pieces of latex that are used as a barrier during oral sex.  Avoid having more than one sex partner.  Do not have sex with someone who has other sex partners.  Do not have sex with anyone you do not know or who is at high risk for an STD.  Avoid risky sex that can break your skin.  Do not have sex if you have open sores on your mouth or skin.  Avoid drinking too much alcohol or taking illegal drugs. Alcohol and drugs can affect your good judgment.  Avoid oral and anal sex acts.  Get shots (vaccines) for HPV and hepatitis.  If you are at risk of being infected with HIV, it is advised that you take a certain medicine daily to prevent HIV infection. This is called pre-exposure prophylaxis (PrEP). You may be at risk if:  You are a man who has sex with other men (MSM).  You are attracted to the opposite sex (heterosexual) and are having sex with more than one partner.  You take drugs with a needle.  You have sex with someone who has HIV.  Talk with your doctor about if you are at high risk of being infected with HIV. If you begin to take PrEP, get tested for HIV  first. Get tested every 3 months for as long as you are taking PrEP.  Get tested for STDs every year if you are sexually active. If you are treated for an STD, get tested again 3 months after you are treated. WHAT SHOULD I DO IF I THINK I HAVE AN STD?  See your doctor.  Tell your sex partner(s) that you have an STD. They should be tested and treated.  Do not have sex until your doctor says it is okay. WHEN SHOULD I GET HELP? Get help right away if:  You have bad belly (abdominal) pain.  You are a man and have puffiness (swelling) or pain in your testicles.  You are a woman and have puffiness in your vagina.   This information is not intended to replace advice given to you by your health care provider. Make sure you discuss any questions you have with your health care provider.   Document Released: 04/13/2004 Document Revised: 03/27/2014 Document Reviewed: 08/30/2012 Elsevier Interactive Patient Education 2016 [employer]  Bacterial Vaginosis Bacterial vaginosis is an infection of the vagina. It happens when too many germs (bacteria) grow in the vagina. Having this infection puts you at risk for getting other infections from sex. Treating this infection can help lower your risk for other infections, such as:   Chlamydia.  Gonorrhea.  HIV.  Herpes. HOME CARE  Take your medicine  as told by your doctor.  Finish your medicine even if you start to feel better.  Tell your sex partner that you have an infection. They should see their doctor for treatment.  During treatment:  Avoid sex or use condoms correctly.  Do not douche.  Do not drink alcohol unless your doctor tells you it is ok.  Do not breastfeed unless your doctor tells you it is ok. GET HELP IF:  You are not getting better after 3 days of treatment.  You have more grey fluid (discharge) coming from your vagina than before.  You have more pain than before.  You have a fever. MAKE SURE YOU:    Understand these instructions.  Will watch your condition.  Will get help right away if you are not doing well or get worse.   This information is not intended to replace advice given to you by your health care provider. Make sure you discuss any questions you have with your health care provider.   Document Released: 12/14/2007 Document Revised: 03/27/2014 Document Reviewed: 10/16/2012 Elsevier Interactive Patient Education [employer]2016 [employer]

## 2015-09-22 NOTE — ED Provider Notes (Signed)
CSN: [id number]     Arrival date & time 09/22/15  2028 History  By signing my name below, I, Evon Slack, attest that this documentation has been prepared under the direction and in the presence of [employer], PA-C. Electronically Signed: Evon Slack, ED Scribe. 09/22/2015. 8:50 PM.    Chief Complaint  Patient presents with  . Exposure to STD   Patient is a 25 y.o. female presenting with STD exposure. The history is provided by the patient. No language interpreter was used.  Exposure to STD Associated symptoms include abdominal pain.   HPI Comments: Hannah Nelson is a 25 y.o. female who presents to the Emergency Department complaining of mild low abdominal pain with associated white vaginal discharge onset 2 days prior. Pt reports that she has recently been sexually active with her partner who has similar symptoms. Pt doesn't report any treatments tried. Denies fever, nausea or vomiting, Dysuria, diarrhea or constipation, back pain or flank pain. No other modifying factors.   Past Medical History  Diagnosis Date  . No pertinent past medical history   . Chlamydia June 2013  . Gonorrhea   . Infection     BV;may get frequently;currently has BV and meds rx'd  . Infection     UTI;not frequently  . Preeclampsia, severe   . Hematoma     following delivery, blood trans. X 4  . History of blood transfusion     postpartum   Past Surgical History  Procedure Laterality Date  . No past surgeries    . Dilation and evacuation N/A 04/21/2013    Procedure: DILATATION AND EVACUATION;  Surgeon: Willodean Rosenthal, MD;  Location: [facility];  Service: Gynecology;  Laterality: N/A;   Family History  Problem Relation Age of Onset  . Heart disease Father     deceased;died before pt was born   Social History  Substance Use Topics  . Smoking status: Current Every Day Smoker -- 2.00 packs/day for 6 years    Types: Cigarettes  . Smokeless tobacco: Never Used  . Alcohol Use: Yes   Comment: occasional    OB History    Gravida Para Term Preterm AB TAB SAB Ectopic Multiple Living   Review of Systems  Constitutional: Negative for fever.  Gastrointestinal: Positive for abdominal pain. Negative for nausea and vomiting.  Genitourinary: Positive for vaginal discharge.   A complete 10 system review of systems was obtained and all systems are negative except as noted in the HPI and PMH.    Allergies  Review of patient's allergies indicates no known allergies.  Home Medications   Prior to Admission medications   Medication Sig Start Date End Date Taking? Authorizing Provider  doxycycline (VIBRAMYCIN) 100 MG capsule Take 1 capsule (100 mg total) by mouth 2 (two) times daily. Patient not taking: Reported on 09/22/2015 04/21/13   Willodean Rosenthal, MD  ferrous sulfate 325 (65 FE) MG tablet Take 1 tablet (325 mg total) by mouth daily with breakfast. Patient not taking: Reported on 09/22/2015 07/24/12   Haroldine Laws, CNM  ibuprofen (ADVIL,MOTRIN) 600 MG tablet Take 1 tablet (600 mg total) by mouth every 6 (six) hours as needed. Patient not taking: Reported on 09/22/2015 04/21/13   Willodean Rosenthal, MD  metroNIDAZOLE (FLAGYL) 500 MG tablet Take 1 tablet (500 mg total) by mouth 2 (two) times daily. 09/22/15   Joycie Peek, PA-C   BP 111/71 mmHg  Pulse 71  Temp(Src) 98.8 F (37.1 C) (Oral)  Resp 16  Ht 5\' 4"  (1.626 m)  Wt 56.7 kg  BMI 21.45 kg/m2  SpO2 99%  LMP 09/06/2015 (Exact Date)   Physical Exam  Constitutional: She is oriented to person, place, and time. She appears well-developed and well-nourished. No distress.  HENT:  Head: Normocephalic and atraumatic.  Mouth/Throat: Oropharynx is clear and moist.  Eyes: Conjunctivae and EOM are normal. Pupils are equal, round, and reactive to light. Right eye exhibits no discharge. Left eye exhibits no discharge. No scleral icterus.  Neck: Neck supple. No tracheal deviation present.  Cardiovascular:  Normal rate, regular rhythm and normal heart sounds.   Pulmonary/Chest: Effort normal and breath sounds normal. No respiratory distress. She has no wheezes. She has no rales.  Abdominal: Soft. There is no tenderness.  Genitourinary:  Chaperone was present for the entire genital exam. No lesions or rashes appreciated on vulva. Cervix visualized on speculum exam and appropriate cultures sampled. Some small lesions around cervix does seem to be white to skin color. Scant blood in vaginal vault. Discharge: Mucousy Upon bi manual exam- No TTP of the adnexa, no cervical motion tenderness. No fullness or masses appreciated. No abnormalities appreciated in structural anatomy.   Musculoskeletal: Normal range of motion. She exhibits no tenderness.  Neurological: She is alert and oriented to person, place, and time.  Cranial Nerves II-XII grossly intact  Skin: Skin is warm and dry. No rash noted.  Psychiatric: She has a normal mood and affect. Her behavior is normal.  Nursing note and vitals reviewed.   ED Course  Procedures (including critical care time) DIAGNOSTIC STUDIES: Oxygen Saturation is 99% on RA, normal by my interpretation.    COORDINATION OF CARE: 8:50 PM-Discussed treatment plan which includes pelvic exam with pt at bedside and pt agreed to plan.     Labs Review Labs Reviewed  WET PREP, GENITAL - Abnormal; Notable for the following:    Clue Cells Wet Prep HPF POC PRESENT (*)    WBC, Wet Prep HPF POC MANY (*)    All other components within normal limits  URINALYSIS, ROUTINE W REFLEX MICROSCOPIC (NOT AT [facility]) - Abnormal; Notable for the following:    APPearance TURBID (*)    Hgb urine dipstick TRACE (*)    Leukocytes, UA MODERATE (*)    All other components within normal limits  URINE MICROSCOPIC-ADD ON - Abnormal; Notable for the following:    Squamous Epithelial / LPF TOO NUMEROUS TO COUNT (*)    Bacteria, UA MANY (*)    All other components within normal limits  URINE  CULTURE  RPR  HIV ANTIBODY (ROUTINE TESTING)  I-STAT BETA HCG BLOOD, ED ([facility], [facility], [facility] ONLY)  GC/CHLAMYDIA PROBE AMP ([place]) NOT AT [facility]    Imaging Review No results found.    EKG Interpretation None     Meds given in ED:  Medications  cefTRIAXone (ROCEPHIN) injection 250 mg (250 mg Intramuscular Given 09/22/15 2216)  azithromycin (ZITHROMAX) tablet 1,000 mg (1,000 mg Oral Given 09/22/15 2216)  lidocaine (XYLOCAINE) 1 % (with pres) injection (0.9 mLs  Given 09/22/15 2218)    New Prescriptions   METRONIDAZOLE (FLAGYL) 500 MG TABLET    Take 1 tablet (500 mg total) by mouth 2 (two) times daily.   Filed Vitals:   09/22/15 2033  BP: 111/71  Pulse: 71  Temp: 98.8 F (37.1 C)  TempSrc: Oral  Resp: 16  Height: 5\' 4"  (1.626 m)  Weight: 56.7 kg  SpO2: 99%    MDM  Patient to be discharged with instructions to follow up with OBGYN. Discussed cervical lesions that will also require OB/GYN follow-up Discussed importance of using protection when sexually active. Pt understands that they have GC/Chlamydia cultures pending and that they will need to inform all sexual partners if results return positive. Pt has been treated prophylacticly with azithromycin and rocephin due to pts history, pelvic exam, and wet prep with increased WBCs. Pt not concerning for PID because hemodynamically stable and no cervical motion tenderness on pelvic exam. Pt has also been treated with flagyl for Bacterial Vaginosis. Urine culture obtained. Pt has been advised to not drink alcohol while on this medication.   Final diagnoses:  Concern about STD in female without diagnosis  BV (bacterial vaginosis)  I personally performed the services described in this documentation, which was scribed in my presence. The recorded information has been reviewed and is accurate.      Joycie PeekBenjamin Gavino Fouch, PA-C 09/22/15 2252  Richardean Canalavid H Yao, MD 09/23/15 Marlyne Beards0002

## 2015-09-22 NOTE — ED Notes (Signed)
PA at bedside.

## 2015-09-22 NOTE — ED Notes (Signed)
Pt stated a few days ago she noted some increase in white and creamy vaginal discharge.  States she does have some aches in her abdomen.

## 2015-09-23 LAB — GC/CHLAMYDIA PROBE AMP (~~LOC~~) NOT AT ARMC
Chlamydia: POSITIVE — AB
Neisseria Gonorrhea: NEGATIVE

## 2015-09-23 LAB — HIV ANTIBODY (ROUTINE TESTING W REFLEX): HIV Screen 4th Generation wRfx: NONREACTIVE

## 2015-09-23 LAB — RPR: RPR Ser Ql: NONREACTIVE

## 2015-09-24 ENCOUNTER — Telehealth: Payer: Self-pay | Admitting: *Deleted

## 2015-09-24 NOTE — ED Notes (Unsigned)
Post ED Visit - Positive Culture Follow-up: Unsuccessful Patient Follow-up  Culture assessed and recommendations reviewed by: [] Nathan Batchelder, Pharm.D. [] Jeremy Frens, Pharm.D., BCPS [] Mike Maccia, Pharm.D. [] Elizabeth Martin, Pharm.D., BCPS [] Minh Pham, Pharm.D., BCPS, AAHIVP [] Michelle Turner, Pharm.D., BCPS, AAHIVP [] Cassie Stewart, Pharm.D. [] Rob Vincent, Pharm.D.  Positive *** culture  [] Patient discharged without antimicrobial prescription and treatment is now indicated [] Organism is resistant to prescribed ED discharge antimicrobial [] Patient with positive STD cultures   Unable to contact patient after 3 attempts, letter will be sent to address on file  Vineet Kinney Talley 09/24/2015, 1:48 PM  

## 2015-09-25 LAB — URINE CULTURE

## 2015-09-26 ENCOUNTER — Telehealth (HOSPITAL_BASED_OUTPATIENT_CLINIC_OR_DEPARTMENT_OTHER): Payer: Self-pay

## 2015-09-26 NOTE — Telephone Encounter (Signed)
Post ED Visit - Positive Culture Follow-up: Unsuccessful Patient Follow-up  Culture assessed and recommendations reviewed by: []  Enzo BiNathan Batchelder, Pharm.D. []  Celedonio MiyamotoJeremy Frens, Pharm.D., BCPS []  Garvin FilaMike Maccia, Pharm.D. []  Georgina PillionElizabeth Martin, Pharm.D., BCPS []  CarthageMinh Pham, VermontPharm.D., BCPS, AAHIVP []  Estella HuskMichelle Turner, Pharm.D., BCPS, AAHIVP []  Tennis Mustassie Stewart, Pharm.D. []  Sherle Poeob Vincent, [address]Sydnee Cabal. Jennifer Marple Pharm D Positive urine culture  []  Patient discharged without antimicrobial prescription and treatment is now indicated [x]  Organism is resistant to prescribed ED discharge antimicrobial []  Patient with positive blood cultures   Unable to contact patient after 3 attempts, letter will be sent to address on file  Jerry CarasCullom, Chet Greenley Burnett 09/26/2015, 1:59 PM

## 2015-10-04 ENCOUNTER — Emergency Department (HOSPITAL_COMMUNITY)
Admission: EM | Admit: 2015-10-04 | Discharge: 2015-10-04 | Disposition: A | Discharge status: Home / Self Care | Payer: Medicaid Other | Attending: Emergency Medicine | Admitting: Emergency Medicine

## 2015-10-04 ENCOUNTER — Encounter (HOSPITAL_COMMUNITY): Payer: Self-pay | Admitting: Emergency Medicine

## 2015-10-04 DIAGNOSIS — N760 Acute vaginitis: Secondary | ICD-10-CM | POA: Insufficient documentation

## 2015-10-04 DIAGNOSIS — B9689 Other specified bacterial agents as the cause of diseases classified elsewhere: Secondary | ICD-10-CM

## 2015-10-04 DIAGNOSIS — F17210 Nicotine dependence, cigarettes, uncomplicated: Secondary | ICD-10-CM | POA: Insufficient documentation

## 2015-10-04 LAB — URINALYSIS, ROUTINE W REFLEX MICROSCOPIC
Bilirubin Urine: NEGATIVE
Glucose, UA: NEGATIVE mg/dL
Hgb urine dipstick: NEGATIVE
Ketones, ur: NEGATIVE mg/dL
Nitrite: POSITIVE — AB
Protein, ur: NEGATIVE mg/dL
Specific Gravity, Urine: 1.02 (ref 1.005–1.030)
pH: 6.5 (ref 5.0–8.0)

## 2015-10-04 LAB — I-STAT BETA HCG BLOOD, ED (MC, WL, AP ONLY): I-stat hCG, quantitative: <5 m[IU]/mL (ref <5)

## 2015-10-04 LAB — URINE MICROSCOPIC-ADD ON: RBC / HPF: NONE SEEN RBC/hpf (ref 0–5)

## 2015-10-04 MED ORDER — METRONIDAZOLE 500 MG PO TABS
2000.0000 mg | ORAL_TABLET | Freq: Once | ORAL | Status: AC
  Administered 2015-10-04: 2000 mg via ORAL
  Filled 2015-10-04: qty 4

## 2015-10-04 NOTE — ED Provider Notes (Signed)
CSN: [id number]     Arrival date & time 10/04/15  2138 History  By signing my name below, I, Hannah Nelson, attest that this documentation has been prepared under the direction and in the presence of Hannah RazorStephen Brayn Eckstein, MD . Electronically Signed: Freida Busmaniana Nelson, Scribe. 10/04/2015. 11:03 PM.    Chief Complaint  Patient presents with  . Medication Refill    The history is provided by the patient. No language interpreter was used.    HPI Comments:  Hannah Nelson is a 25 y.o. female who presents to the Emergency Department for medication refill. Pt states she was evaluated in the ED ~2 weeks ago and was diagnosed with BV. She was discharged with RX for flagyl but lost the prescription and has returned for a new prescription. She denies acute symptoms/complaints; states she feels the same as before when she presented with mild abdominal pain and vaginal discharge.  No modifying factors noted.   Past Medical History  Diagnosis Date  . No pertinent past medical history   . Chlamydia June 2013  . Gonorrhea   . Infection     BV;may get frequently;currently has BV and meds rx'd  . Infection     UTI;not frequently  . Preeclampsia, severe   . Hematoma     following delivery, blood trans. X 4  . History of blood transfusion     postpartum   Past Surgical History  Procedure Laterality Date  . No past surgeries    . Dilation and evacuation N/A 04/21/2013    Procedure: DILATATION AND EVACUATION;  Surgeon: Hannah Rosenthalarolyn Harraway-Smith, MD;  Location: [facility];  Service: Gynecology;  Laterality: N/A;   Family History  Problem Relation Age of Onset  . Heart disease Father     deceased;died before pt was born   Social History  Substance Use Topics  . Smoking status: Current Every Day Smoker -- 2.00 packs/day for 6 years    Types: Cigarettes  . Smokeless tobacco: Never Used  . Alcohol Use: Yes     Comment: occasional    OB History    Gravida Para Term Preterm AB TAB SAB Ectopic Multiple Living   2 1 1        1      Review of Systems  Constitutional: Negative for fever and chills.  Respiratory: Negative for shortness of breath.   Cardiovascular: Negative for chest pain.  Gastrointestinal: Positive for abdominal pain.  Genitourinary: Positive for vaginal discharge.  All other systems reviewed and are negative.     Allergies  Review of patient's allergies indicates no known allergies.  Home Medications   Prior to Admission medications   Medication Sig Start Date End Date Taking? Authorizing Provider  doxycycline (VIBRAMYCIN) 100 MG capsule Take 1 capsule (100 mg total) by mouth 2 (two) times daily. Patient not taking: Reported on 09/22/2015 04/21/13   Hannah Rosenthalarolyn Harraway-Smith, MD  ferrous sulfate 325 (65 FE) MG tablet Take 1 tablet (325 mg total) by mouth daily with breakfast. Patient not taking: Reported on 09/22/2015 07/24/12   Hannah Nelson, CNM  ibuprofen (ADVIL,MOTRIN) 600 MG tablet Take 1 tablet (600 mg total) by mouth every 6 (six) hours as needed. Patient not taking: Reported on 09/22/2015 04/21/13   Hannah Rosenthalarolyn Harraway-Smith, MD  metroNIDAZOLE (FLAGYL) 500 MG tablet Take 1 tablet (500 mg total) by mouth 2 (two) times daily. Patient not taking: Reported on 10/04/2015 09/22/15   Hannah PeekBenjamin Cartner, PA-C   BP 112/77 mmHg  Pulse 63  Temp(Src) 98.5 F (36.9  C) (Oral)  Resp 18  Ht  (1.626 m)  Wt 125 lb (56.7 kg)  BMI 21.45 kg/m2  SpO2 100%  LMP 09/06/2015 (Exact Date) Physical Exam  Constitutional: She is oriented to person, place, and time. She appears well-developed and well-nourished. No distress.  HENT:  Head: Normocephalic and atraumatic.  Eyes: EOM are normal.  Neck: Normal range of motion.  Cardiovascular: Normal rate, regular rhythm and normal heart sounds.   Pulmonary/Chest: Effort normal and breath sounds normal.  Abdominal: Soft. She exhibits no distension. There is no tenderness.  Musculoskeletal: Normal range of motion.  Neurological: She is alert and oriented to  person, place, and time.  Skin: Skin is warm and dry.  Psychiatric: She has a normal mood and affect. Judgment normal.  Nursing note and vitals reviewed.   ED Course  Procedures  DIAGNOSTIC STUDIES:  Oxygen Saturation is 100% on RA, normal by my interpretation.    COORDINATION OF CARE:  11:00 PM Discussed treatment plan with pt at bedside and pt agreed to plan.  Labs Review Labs Reviewed  URINALYSIS, ROUTINE W REFLEX MICROSCOPIC (NOT AT [facility]) - Abnormal; Notable for the following:    APPearance CLOUDY (*)    Nitrite POSITIVE (*)    Leukocytes, UA SMALL (*)    All other components within normal limits  URINE MICROSCOPIC-ADD ON - Abnormal; Notable for the following:    Squamous Epithelial / LPF 6-30 (*)    Bacteria, UA MANY (*)    All other components within normal limits  I-STAT BETA HCG BLOOD, ED ([facility], [facility], [facility] ONLY)    Imaging Review No results found. I have personally reviewed and evaluated these images and lab results as part of my medical decision-making.   EKG Interpretation None      MDM   Final diagnoses:  BV (bacterial vaginosis)    25yF who is here just to get flagyl for previously diagnosed BV. Given 2g as single dose. UA was ordered though today which suggestive of UTI. She is asymptomatic though. Will send culture. Tx of this deferred.   I personally preformed the services scribed in my presence. The recorded information has been reviewed is accurate. Hannah Razor, MD.    Hannah Razor, MD 10/06/15 [phone]

## 2015-10-04 NOTE — ED Notes (Signed)
Patient was here two weeks ago and got a prescription for BV. Patient lost it and would like another prescription.

## 2015-10-04 NOTE — Discharge Instructions (Signed)

## 2015-12-30 ENCOUNTER — Emergency Department (HOSPITAL_COMMUNITY)
Admission: EM | Admit: 2015-12-30 | Discharge: 2015-12-30 | Disposition: A | Discharge status: Home / Self Care | Payer: Medicaid Other | Attending: Emergency Medicine | Admitting: Emergency Medicine

## 2015-12-30 ENCOUNTER — Emergency Department (HOSPITAL_COMMUNITY): Payer: Medicaid Other

## 2015-12-30 ENCOUNTER — Encounter (HOSPITAL_COMMUNITY): Payer: Self-pay | Admitting: Emergency Medicine

## 2015-12-30 DIAGNOSIS — Z23 Encounter for immunization: Secondary | ICD-10-CM | POA: Insufficient documentation

## 2015-12-30 DIAGNOSIS — Y929 Unspecified place or not applicable: Secondary | ICD-10-CM | POA: Insufficient documentation

## 2015-12-30 DIAGNOSIS — Y999 Unspecified external cause status: Secondary | ICD-10-CM | POA: Insufficient documentation

## 2015-12-30 DIAGNOSIS — F17210 Nicotine dependence, cigarettes, uncomplicated: Secondary | ICD-10-CM | POA: Insufficient documentation

## 2015-12-30 DIAGNOSIS — Y939 Activity, unspecified: Secondary | ICD-10-CM | POA: Insufficient documentation

## 2015-12-30 DIAGNOSIS — S01352A Open bite of left ear, initial encounter: Secondary | ICD-10-CM

## 2015-12-30 DIAGNOSIS — S01302A Unspecified open wound of left ear, initial encounter: Secondary | ICD-10-CM | POA: Insufficient documentation

## 2015-12-30 DIAGNOSIS — S20219A Contusion of unspecified front wall of thorax, initial encounter: Secondary | ICD-10-CM | POA: Insufficient documentation

## 2015-12-30 DIAGNOSIS — R0781 Pleurodynia: Secondary | ICD-10-CM

## 2015-12-30 LAB — PREGNANCY, URINE: Preg Test, Ur: NEGATIVE

## 2015-12-30 MED ORDER — HYDROCODONE-ACETAMINOPHEN 5-325 MG PO TABS: 1.0000 | ORAL_TABLET | ORAL | 0 refills | Status: DC | PRN

## 2015-12-30 MED ORDER — AMOXICILLIN-POT CLAVULANATE 875-125 MG PO TABS: 1.0000 | ORAL_TABLET | Freq: Two times a day (BID) | ORAL | 0 refills | Status: DC

## 2015-12-30 MED ORDER — AMOXICILLIN-POT CLAVULANATE 875-125 MG PO TABS
1.0000 | ORAL_TABLET | Freq: Once | ORAL | Status: AC
  Administered 2015-12-30: 1 via ORAL
  Filled 2015-12-30: qty 1

## 2015-12-30 MED ORDER — TETANUS-DIPHTH-ACELL PERTUSSIS 5-2.5-18.5 LF-MCG/0.5 IM SUSP
0.5000 mL | Freq: Once | INTRAMUSCULAR | Status: AC
  Administered 2015-12-30: 0.5 mL via INTRAMUSCULAR
  Filled 2015-12-30: qty 0.5

## 2015-12-30 MED ORDER — RIVAROXABAN 15 MG PO TABS: 15.0000 mg | ORAL_TABLET | Freq: Once | ORAL | Status: DC

## 2015-12-30 MED ORDER — HYDROCODONE-ACETAMINOPHEN 5-325 MG PO TABS
1.0000 | ORAL_TABLET | Freq: Once | ORAL | Status: AC
  Administered 2015-12-30: 1 via ORAL
  Filled 2015-12-30: qty 1
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
## 2015-12-30 NOTE — ED Notes (Signed)
Pt on phone in restroom.
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
## 2016-01-05 ENCOUNTER — Telehealth (HOSPITAL_BASED_OUTPATIENT_CLINIC_OR_DEPARTMENT_OTHER): Payer: Self-pay | Admitting: Emergency Medicine

## 2016-01-05 NOTE — Telephone Encounter (Signed)
Lost to followup 

## 2018-05-14 ENCOUNTER — Emergency Department (HOSPITAL_COMMUNITY)
Admission: EM | Admit: 2018-05-14 | Discharge: 2018-05-14 | Disposition: A | Discharge status: Home / Self Care | Payer: Self-pay | Attending: Emergency Medicine | Admitting: Emergency Medicine

## 2018-05-14 ENCOUNTER — Other Ambulatory Visit: Payer: Self-pay

## 2018-05-14 ENCOUNTER — Encounter (HOSPITAL_COMMUNITY): Payer: Self-pay | Admitting: Emergency Medicine

## 2018-05-14 ENCOUNTER — Ambulatory Visit (HOSPITAL_COMMUNITY): Payer: Self-pay | Attending: Emergency Medicine

## 2018-05-14 DIAGNOSIS — R002 Palpitations: Secondary | ICD-10-CM | POA: Insufficient documentation

## 2018-05-14 DIAGNOSIS — R200 Anesthesia of skin: Secondary | ICD-10-CM | POA: Insufficient documentation

## 2018-05-14 DIAGNOSIS — F419 Anxiety disorder, unspecified: Secondary | ICD-10-CM | POA: Insufficient documentation

## 2018-05-14 DIAGNOSIS — R202 Paresthesia of skin: Secondary | ICD-10-CM | POA: Insufficient documentation

## 2018-05-14 DIAGNOSIS — F17210 Nicotine dependence, cigarettes, uncomplicated: Secondary | ICD-10-CM | POA: Insufficient documentation

## 2018-05-14 DIAGNOSIS — M79602 Pain in left arm: Secondary | ICD-10-CM | POA: Insufficient documentation

## 2018-05-14 DIAGNOSIS — R079 Chest pain, unspecified: Secondary | ICD-10-CM | POA: Insufficient documentation

## 2018-05-14 MED ORDER — LORAZEPAM 1 MG PO TABS
1.0000 mg | ORAL_TABLET | Freq: Once | ORAL | Status: AC
  Administered 2018-05-14: 1 mg via ORAL
  Filled 2018-05-14: qty 1

## 2018-05-14 NOTE — ED Triage Notes (Signed)
Patient presents with anxiety and chest pain. Patient shaking and complaining of finger tip numbness. Patient has had multiple stressors recently.

## 2018-05-14 NOTE — ED Provider Notes (Signed)
[facility]-EMERGENCY DEPT Provider Note   CSN: [id number] Arrival date & time: 05/14/18  [id number]    History   Chief Complaint Chief Complaint  Patient presents with  . Chest Pain  . Anxiety    HPI Hannah Nelson is a 28 y.o. female.     Patient presents to the emergency department with a chief complaint of chest pain, palpitations, feeling anxious, and having intermittent numbness and tingling in her fingertips.  She states the symptoms have been intermittent over the past week.  She reports being under a lot of stress.  Denies any weakness.  Denies any headache.  Denies neck pain.  Denies any treatments prior to arrival.  Denies any drug or alcohol use.  Of note, she reports giving plasma routinely, and states that she has had pain in her arm since giving this a few weeks ago.  The history is provided by the patient. No language interpreter was used.    Past Medical History:  Diagnosis Date  . Chlamydia June 2013  . Gonorrhea   . Hematoma    following delivery, blood trans. X 4  . History of blood transfusion    postpartum  . Infection    BV;may get frequently;currently has BV and meds rx'd  . Infection    UTI;not frequently  . No pertinent past medical history   . Preeclampsia, severe     Patient Active Problem List   Diagnosis Date Noted  . Postpartum fever 07/25/2012  . Vaginal delivery 07/21/2012  . Vaginal hematoma 07/21/2012    Past Surgical History:  Procedure Laterality Date  . DILATION AND EVACUATION N/A 04/21/2013   Procedure: DILATATION AND EVACUATION;  Surgeon: Willodean Rosenthalarolyn Harraway-Smith, MD;  Location: [facility];  Service: Gynecology;  Laterality: N/A;  . NO PAST SURGERIES       OB History    Gravida  2   Para  1   Term  1   Preterm      AB      Living  1     SAB      TAB      Ectopic      Multiple      Live Births  1            Home Medications    Prior to Admission medications   Medication Sig Start Date  End Date Taking? Authorizing Provider  amoxicillin-clavulanate (AUGMENTIN) 875-125 MG tablet Take 1 tablet by mouth 2 (two) times daily. 12/30/15   Bethel BornGekas, Kelly Marie, PA-C  doxycycline (VIBRAMYCIN) 100 MG capsule Take 1 capsule (100 mg total) by mouth 2 (two) times daily. Patient not taking: Reported on 09/22/2015 04/21/13   Willodean RosenthalHarraway-Smith, Carolyn, MD  ferrous sulfate 325 (65 FE) MG tablet Take 1 tablet (325 mg total) by mouth daily with breakfast. Patient not taking: Reported on 09/22/2015 07/24/12   Haroldine Lawsxley, Jennifer, CNM  HYDROcodone-acetaminophen (NORCO/VICODIN) 5-325 MG tablet Take 1-2 tablets by mouth every 4 (four) hours as needed. 12/30/15   Bethel BornGekas, Kelly Marie, PA-C  ibuprofen (ADVIL,MOTRIN) 600 MG tablet Take 1 tablet (600 mg total) by mouth every 6 (six) hours as needed. Patient not taking: Reported on 09/22/2015 04/21/13   Willodean RosenthalHarraway-Smith, Carolyn, MD  metroNIDAZOLE (FLAGYL) 500 MG tablet Take 1 tablet (500 mg total) by mouth 2 (two) times daily. Patient not taking: Reported on 10/04/2015 09/22/15   Joycie Peekartner, Benjamin, PA-C    Family History Family History  Problem Relation Age of Onset  . Heart disease  Father        deceased;died before pt was born    Social History Social History   Tobacco Use  . Smoking status: Current Every Day Smoker    Packs/day: 2.00    Years: 6.00    Pack years: 12.00    Types: Cigarettes  . Smokeless tobacco: Never Used  Substance Use Topics  . Alcohol use: Yes    Comment: occasional   . Drug use: Yes    Frequency: 7.0 times per week     Allergies   Patient has no known allergies.   Review of Systems Review of Systems  All other systems reviewed and are negative.    Physical Exam Updated Vital Signs There were no vitals taken for this visit.  Physical Exam Vitals signs and nursing note reviewed.  Constitutional:      Appearance: She is well-developed.     Comments: Anxious appearing  HENT:     Head: Normocephalic and atraumatic.  Eyes:      Conjunctiva/sclera: Conjunctivae normal.     Pupils: Pupils are equal, round, and reactive to light.  Neck:     Musculoskeletal: Normal range of motion and neck supple.  Cardiovascular:     Rate and Rhythm: Normal rate and regular rhythm.     Heart sounds: No murmur. No friction rub. No gallop.   Pulmonary:     Effort: Pulmonary effort is normal. No respiratory distress.     Breath sounds: Normal breath sounds. No wheezing or rales.  Chest:     Chest wall: No tenderness.  Abdominal:     General: Bowel sounds are normal. There is no distension.     Palpations: Abdomen is soft. There is no mass.     Tenderness: There is no abdominal tenderness. There is no guarding or rebound.  Musculoskeletal: Normal range of motion.        General: No tenderness.     Comments: Tenderness to palpation over L medial upper arm without redness or deformity  Skin:    General: Skin is warm and dry.  Neurological:     Mental Status: She is alert and oriented to person, place, and time.     Comments: CN 3-12 intact, speech is clear, sensation and strength intact throughout, normal finger to nose  Psychiatric:        Behavior: Behavior normal.        Thought Content: Thought content normal.        Judgment: Judgment normal.      ED Treatments / Results  Labs (all labs ordered are listed, but only abnormal results are displayed) Labs Reviewed - No data to display  EKG None  Radiology No results found.  Procedures Procedures (including critical care time)  Medications Ordered in ED Medications  LORazepam (ATIVAN) tablet 1 mg (has no administration in time range)     Initial Impression / Assessment and Plan / ED Course  I have reviewed the triage vital signs and the nursing notes.  Pertinent labs & imaging results that were available during my care of the patient were reviewed by me and considered in my medical decision making (see chart for details).        Patient with chest pain,  and anxiety also reports tingling in her extremities and pain in her left upper arm.  Symptoms seem to be anxiety driven, and she appears very anxious at the bedside.  She is low risk for ACS and PE.  Could consider  upper extremity doppler to rule out UE DVT, though I think this is less likely given lack of findings on PE.  Will check EKG and give a dose of ativan.    Patient is neurovascularly intact.  KG reassuring.  Do not feel the additional emergent work-up is indicated at this time.  Will encourage patient follow-up on an outpatient basis.  Will give instructions regarding upper extremity DVT study, as the patient is concerned about this with her frequent plasma donations.  Final Clinical Impressions(s) / ED Diagnoses   Final diagnoses:  Anxiety  Pain of left upper extremity    ED Discharge Orders    None       Dashea, Sergent, PA-C 05/14/18 0226    Ward, Layla Maw, DO 05/14/18 [phone]

## 2018-05-23 ENCOUNTER — Encounter (HOSPITAL_COMMUNITY): Payer: Self-pay | Admitting: Emergency Medicine

## 2018-05-23 ENCOUNTER — Emergency Department (HOSPITAL_COMMUNITY)
Admission: EM | Admit: 2018-05-23 | Discharge: 2018-05-24 | Disposition: A | Discharge status: Home / Self Care | Payer: Self-pay | Attending: Emergency Medicine | Admitting: Emergency Medicine

## 2018-05-23 ENCOUNTER — Emergency Department (HOSPITAL_COMMUNITY): Payer: Self-pay

## 2018-05-23 DIAGNOSIS — Z79899 Other long term (current) drug therapy: Secondary | ICD-10-CM | POA: Insufficient documentation

## 2018-05-23 DIAGNOSIS — R0789 Other chest pain: Secondary | ICD-10-CM | POA: Insufficient documentation

## 2018-05-23 DIAGNOSIS — F17210 Nicotine dependence, cigarettes, uncomplicated: Secondary | ICD-10-CM | POA: Insufficient documentation

## 2018-05-23 LAB — I-STAT BETA HCG BLOOD, ED (MC, WL, AP ONLY): I-stat hCG, quantitative: <5 m[IU]/mL (ref <5)

## 2018-05-23 LAB — BASIC METABOLIC PANEL
Anion gap: 3 — ABNORMAL LOW (ref 5–15)
BUN: 7 mg/dL (ref 6–20)
CO2: 24 mmol/L (ref 22–32)
Calcium: 8.6 mg/dL — ABNORMAL LOW (ref 8.9–10.3)
Chloride: 110 mmol/L (ref 98–111)
Creatinine, Ser: 1.12 mg/dL — ABNORMAL HIGH (ref 0.44–1.00)
GFR calc Af Amer: >60 mL/min (ref >60)
GFR calc non Af Amer: >60 mL/min (ref >60)
Glucose, Bld: 99 mg/dL (ref 70–99)
Potassium: 3.4 mmol/L — ABNORMAL LOW (ref 3.5–5.1)
Sodium: 137 mmol/L (ref 135–145)

## 2018-05-23 LAB — CBC
HCT: 42.9 % (ref 36.0–46.0)
Hemoglobin: 14 g/dL (ref 12.0–15.0)
MCH: 29.5 pg (ref 26.0–34.0)
MCHC: 32.6 g/dL (ref 30.0–36.0)
MCV: 90.3 fL (ref 80.0–100.0)
Platelets: 268 10*3/uL (ref 150–400)
RBC: 4.75 MIL/uL (ref 3.87–5.11)
RDW: 14 % (ref 11.5–15.5)
WBC: 14 10*3/uL — ABNORMAL HIGH (ref 4.0–10.5)
nRBC: 0 % (ref 0.0–0.2)

## 2018-05-23 LAB — I-STAT TROPONIN, ED: Troponin i, poc: 0 ng/mL (ref 0.00–0.08)

## 2018-05-23 MED ORDER — SODIUM CHLORIDE 0.9% FLUSH: 3.0000 mL | Freq: Once | INTRAVENOUS | Status: DC

## 2018-05-23 NOTE — ED Notes (Signed)
Patient transported to X-ray 

## 2018-05-23 NOTE — ED Triage Notes (Signed)
Pt reports chest pain/tightness and left arm pain for two years, worsening in the past two weeks.  Pt reports she feels real "heavy".

## 2018-05-24 LAB — D-DIMER, QUANTITATIVE: D-Dimer, Quant: <0.27 ug/mL-FEU (ref 0.00–0.50)

## 2018-05-24 LAB — I-STAT TROPONIN, ED: Troponin i, poc: 0.01 ng/mL (ref 0.00–0.08)

## 2018-05-24 MED ORDER — ACETAMINOPHEN 500 MG PO TABS: 500.0000 mg | ORAL_TABLET | Freq: Four times a day (QID) | ORAL | 0 refills | Status: AC | PRN

## 2018-05-24 MED ORDER — SODIUM CHLORIDE 0.9 % IV BOLUS
1000.0000 mL | Freq: Once | INTRAVENOUS | Status: AC
  Administered 2018-05-24: 1000 mL via INTRAVENOUS

## 2018-05-24 MED ORDER — IBUPROFEN 600 MG PO TABS: 600.0000 mg | ORAL_TABLET | Freq: Four times a day (QID) | ORAL | 0 refills | Status: AC | PRN

## 2018-05-24 MED ORDER — KETOROLAC TROMETHAMINE 30 MG/ML IJ SOLN
30.0000 mg | Freq: Once | INTRAMUSCULAR | Status: AC
  Administered 2018-05-24: 30 mg via INTRAVENOUS
  Filled 2018-05-24: qty 1

## 2018-05-24 NOTE — ED Provider Notes (Signed)
[facility] EMERGENCY DEPARTMENT Provider Note   CSN: [id number] Arrival date & time: 05/23/18  2041    History   Chief Complaint Chief Complaint  Patient presents with  . Chest Pain  . Arm Pain    HPI Hannah Nelson is a 28 y.o. female with history of anxiety who presents with a 2 to 3-week history of intermittent chest pain.  She describes it as sharp.  It radiates to her neck sometimes.  She reports numbness and tingling in her hands and feet associated.  Nothing makes the pain better or worse.  It is not pleuritic.  She denies any recent long trips, surgeries, known cancer, new leg pain or swelling.  Patient reports she does have some pain in her left arm.  She was evaluated for this at the end of February and told to follow-up for upper extremity venous ultrasound, however has not followed up for this yet.  She reports this started after giving plasma a couple weeks prior.  Patient denies any abdominal pain, nausea, vomiting, urinary symptoms, fever.  She also denies any heavy lifting recently.  Patient reports she has had a cough for the past couple days.  She reports she has been under a lot of stress at home.     HPI  Past Medical History:  Diagnosis Date  . Chlamydia June 2013  . Gonorrhea   . Hematoma    following delivery, blood trans. X 4  . History of blood transfusion    postpartum  . Infection    BV;may get frequently;currently has BV and meds rx'd  . Infection    UTI;not frequently  . No pertinent past medical history   . Preeclampsia, severe     Patient Active Problem List   Diagnosis Date Noted  . Postpartum fever 07/25/2012  . Vaginal delivery 07/21/2012  . Vaginal hematoma 07/21/2012    Past Surgical History:  Procedure Laterality Date  . DILATION AND EVACUATION N/A 04/21/2013   Procedure: DILATATION AND EVACUATION;  Surgeon: Willodean Rosenthal, MD;  Location: [facility];  Service: Gynecology;  Laterality: N/A;  . NO PAST SURGERIES         OB History    Gravida  2   Para  1   Term  1   Preterm      AB      Living  1     SAB      TAB      Ectopic      Multiple      Live Births  1            Home Medications    Prior to Admission medications   Medication Sig Start Date End Date Taking? Authorizing Provider  acetaminophen (TYLENOL) 500 MG tablet Take 1 tablet (500 mg total) by mouth every 6 (six) hours as needed. 05/24/18   Emi Holes, PA-C  ferrous sulfate 325 (65 FE) MG tablet Take 1 tablet (325 mg total) by mouth daily with breakfast. Patient not taking: Reported on 09/22/2015 07/24/12   Haroldine Laws, CNM  ibuprofen (ADVIL,MOTRIN) 600 MG tablet Take 1 tablet (600 mg total) by mouth every 6 (six) hours as needed. 05/24/18   Emi Holes, PA-C    Family History Family History  Problem Relation Age of Onset  . Heart disease Father        deceased;died before pt was born    Social History Social History   Tobacco Use  .  Smoking status: Current Every Day Smoker    Packs/day: 2.00    Years: 6.00    Pack years: 12.00    Types: Cigarettes  . Smokeless tobacco: Never Used  Substance Use Topics  . Alcohol use: Yes    Comment: occasional   . Drug use: Yes    Frequency: 7.0 times per week     Allergies   Patient has no known allergies.   Review of Systems Review of Systems  Constitutional: Negative for chills and fever.  HENT: Negative for facial swelling and sore throat.   Respiratory: Positive for cough. Negative for shortness of breath.   Cardiovascular: Positive for chest pain. Negative for leg swelling.  Gastrointestinal: Negative for abdominal pain, nausea and vomiting.  Genitourinary: Negative for dysuria.  Musculoskeletal: Negative for back pain.  Skin: Negative for rash and wound.  Neurological: Positive for numbness (paresthesia to hands and feet). Negative for headaches.  Psychiatric/Behavioral: The patient is not nervous/anxious.      Physical Exam Updated  Vital Signs BP 114/83   Pulse 78   Temp 98.2 F (36.8 C) (Oral)   Resp 16   Ht 5\' 4"  (1.626 m)   Wt 59 kg   SpO2 100%   BMI 22.31 kg/m   Physical Exam Vitals signs and nursing note reviewed.  Constitutional:      General: She is not in acute distress.    Appearance: She is well-developed. She is not diaphoretic.  HENT:     Head: Normocephalic and atraumatic.     Mouth/Throat:     Pharynx: No oropharyngeal exudate.  Eyes:     General: No scleral icterus.       Right eye: No discharge.        Left eye: No discharge.     Conjunctiva/sclera: Conjunctivae normal.     Pupils: Pupils are equal, round, and reactive to light.  Neck:     Musculoskeletal: Normal range of motion and neck supple.     Thyroid: No thyromegaly.  Cardiovascular:     Rate and Rhythm: Normal rate and regular rhythm.     Heart sounds: Normal heart sounds. No murmur. No friction rub. No gallop.   Pulmonary:     Effort: Pulmonary effort is normal. No respiratory distress.     Breath sounds: Normal breath sounds. No stridor. No wheezing or rales.  Chest:     Chest wall: Tenderness present.    Abdominal:     General: Bowel sounds are normal. There is no distension.     Palpations: Abdomen is soft.     Tenderness: There is no abdominal tenderness. There is no guarding or rebound.  Lymphadenopathy:     Cervical: No cervical adenopathy.  Skin:    General: Skin is warm and dry.     Coloration: Skin is not pale.     Findings: No rash.  Neurological:     Mental Status: She is alert.     Coordination: Coordination normal.      ED Treatments / Results  Labs (all labs ordered are listed, but only abnormal results are displayed) Labs Reviewed  BASIC METABOLIC PANEL - Abnormal; Notable for the following components:      Result Value   Potassium 3.4 (*)    Creatinine, Ser 1.12 (*)    Calcium 8.6 (*)    Anion gap 3 (*)    All other components within normal limits  CBC - Abnormal; Notable for the  following components:  WBC 14.0 (*)    All other components within normal limits  D-DIMER, QUANTITATIVE (NOT AT [facility])  I-STAT TROPONIN, ED  I-STAT BETA HCG BLOOD, ED ([facility], [facility], [facility] ONLY)  I-STAT TROPONIN, ED    EKG EKG Interpretation  Date/Time:  Thursday May 23 2018 20:44:39 EST Ventricular Rate:  89 PR Interval:  128 QRS Duration: 84 QT Interval:  350 QTC Calculation: 425 R Axis:   72 Text Interpretation:  Normal sinus rhythm Normal ECG Confirmed by Zadie Rhine ([id number]) on 05/24/2018 1:36:26 AM   Radiology Dg Chest 2 View  Result Date: 05/23/2018 CLINICAL DATA:  Chest pain EXAM: CHEST - 2 VIEW COMPARISON:  12/30/2015 FINDINGS: The heart size and mediastinal contours are within normal limits. Both lungs are clear. The visualized skeletal structures are unremarkable. IMPRESSION: No active cardiopulmonary disease. Electronically Signed   By: Jasmine Pang M.D.   On: 05/23/2018 21:11    Procedures Procedures (including critical care time)  Medications Ordered in ED Medications  sodium chloride flush (NS) 0.9 % injection 3 mL (has no administration in time range)  sodium chloride 0.9 % bolus 1,000 mL (0 mLs Intravenous Stopped 05/24/18 0249)  ketorolac (TORADOL) 30 MG/ML injection 30 mg (30 mg Intravenous Given 05/24/18 0149)     Initial Impression / Assessment and Plan / ED Course  I have reviewed the triage vital signs and the nursing notes.  Pertinent labs & imaging results that were available during my care of the patient were reviewed by me and considered in my medical decision making (see chart for details).        Patient presenting with probable chest wall pain versus chest pain related to anxiety.  Delta troponin is negative.  D-dimer is negative.  BMP shows creatinine 1.12.  Patient has nonspecific leukocytosis of 14.0, but she has had a cough for the past few days and most likely viral cause.  Chest x-ray negative.  EKG shows NSR.  Patient given 1 L of fluids and  Toradol and is feeling much better.  Ibuprofen and Tylenol discussed.  Follow-up to PCP for further management and work-up if symptoms are continuing.  Return precautions discussed.  Patient understands and agrees with plan.  Patient vital stable throughout ED course and discharged in satisfactory condition.  Patient also evaluated by my attending, Dr. Bebe Shaggy, who guided the patient's management and agrees with plan.  Final Clinical Impressions(s) / ED Diagnoses   Final diagnoses:  Atypical chest pain    ED Discharge Orders         Ordered    ibuprofen (ADVIL,MOTRIN) 600 MG tablet  Every 6 hours PRN     05/24/18 0347    acetaminophen (TYLENOL) 500 MG tablet  Every 6 hours PRN     05/24/18 0347           Emi Holes, PA-C 05/24/18 0441    Zadie Rhine, MD 05/24/18 [phone]

## 2018-05-24 NOTE — Discharge Instructions (Signed)
We have ruled out emergent causes of your chest pain today.  I suspect your chest pain is related to anxiety or the muscles/cartilage in your chest.  You can alternate with ibuprofen and Tylenol for your pain.  Please follow-up and establish care with a primary care provider by calling one of the offices below.  Please follow-up for your upper extremity ultrasound as planned.  Please return to the emergency department if you develop any new or worsening symptoms.

## 2018-05-24 NOTE — ED Provider Notes (Signed)
Patient seen/examined in the Emergency Department in conjunction with [facility] Patient reports chest pain and paresthesias Exam : awake/alert, no murmurs noted, lungs CTA-B appears mildly anxious Distal pulses intact, no edema noted to any of her extremities Plan: pt with second ER visit for similar symptoms Workup expanded for repeat visit Pt otherwise well appearing/nontoxic     Zadie Rhine, MD 05/24/18 0215

## 2018-05-24 NOTE — ED Notes (Signed)
Nurse starting IV and will draw labs. 

## 2018-05-28 ENCOUNTER — Other Ambulatory Visit: Payer: Self-pay

## 2018-05-28 ENCOUNTER — Emergency Department (HOSPITAL_COMMUNITY): Payer: Medicaid Other

## 2018-05-28 ENCOUNTER — Encounter (HOSPITAL_COMMUNITY): Payer: Self-pay | Admitting: *Deleted

## 2018-05-28 ENCOUNTER — Emergency Department (HOSPITAL_COMMUNITY)
Admission: EM | Admit: 2018-05-28 | Discharge: 2018-05-29 | Disposition: A | Discharge status: Home / Self Care | Payer: Medicaid Other | Attending: Emergency Medicine | Admitting: Emergency Medicine

## 2018-05-28 DIAGNOSIS — R202 Paresthesia of skin: Secondary | ICD-10-CM | POA: Insufficient documentation

## 2018-05-28 DIAGNOSIS — F419 Anxiety disorder, unspecified: Secondary | ICD-10-CM

## 2018-05-28 DIAGNOSIS — F17210 Nicotine dependence, cigarettes, uncomplicated: Secondary | ICD-10-CM | POA: Insufficient documentation

## 2018-05-28 DIAGNOSIS — N390 Urinary tract infection, site not specified: Secondary | ICD-10-CM

## 2018-05-28 LAB — BASIC METABOLIC PANEL
Anion gap: 4 — ABNORMAL LOW (ref 5–15)
BUN: 9 mg/dL (ref 6–20)
CO2: 25 mmol/L (ref 22–32)
Calcium: 8.8 mg/dL — ABNORMAL LOW (ref 8.9–10.3)
Chloride: 112 mmol/L — ABNORMAL HIGH (ref 98–111)
Creatinine, Ser: 1.38 mg/dL — ABNORMAL HIGH (ref 0.44–1.00)
GFR calc Af Amer: >60 mL/min (ref >60)
GFR calc non Af Amer: 53 mL/min — ABNORMAL LOW (ref >60)
Glucose, Bld: 95 mg/dL (ref 70–99)
Potassium: 3.5 mmol/L (ref 3.5–5.1)
Sodium: 141 mmol/L (ref 135–145)

## 2018-05-28 LAB — CBC
HCT: 42.5 % (ref 36.0–46.0)
Hemoglobin: 14.2 g/dL (ref 12.0–15.0)
MCH: 29.9 pg (ref 26.0–34.0)
MCHC: 33.4 g/dL (ref 30.0–36.0)
MCV: 89.5 fL (ref 80.0–100.0)
Platelets: 245 10*3/uL (ref 150–400)
RBC: 4.75 MIL/uL (ref 3.87–5.11)
RDW: 13.9 % (ref 11.5–15.5)
WBC: 13.9 10*3/uL — ABNORMAL HIGH (ref 4.0–10.5)
nRBC: 0 % (ref 0.0–0.2)

## 2018-05-28 LAB — I-STAT BETA HCG BLOOD, ED (MC, WL, AP ONLY)

## 2018-05-28 LAB — I-STAT TROPONIN, ED: Troponin i, poc: 0 ng/mL (ref 0.00–0.08)

## 2018-05-28 MED ORDER — SODIUM CHLORIDE 0.9% FLUSH
3.0000 mL | Freq: Once | INTRAVENOUS | Status: AC
  Administered 2018-05-29: 3 mL via INTRAVENOUS

## 2018-05-28 NOTE — ED Triage Notes (Signed)
Bilateral legs swelling and pain for several days. Also reports upper chest pain that has been going "for several weeks", says the pain is still the same. Has had a cough, no fevers.

## 2018-05-29 LAB — URINALYSIS, ROUTINE W REFLEX MICROSCOPIC
Bilirubin Urine: NEGATIVE
Glucose, UA: NEGATIVE mg/dL
Hgb urine dipstick: NEGATIVE
Ketones, ur: NEGATIVE mg/dL
Nitrite: NEGATIVE
PH: 5 (ref 5.0–8.0)
Protein, ur: 30 mg/dL — AB
Specific Gravity, Urine: 1.026 (ref 1.005–1.030)
Squamous Epithelial / HPF: >50 — ABNORMAL HIGH (ref 0–5)
WBC, UA: >50 WBC/hpf — ABNORMAL HIGH (ref 0–5)

## 2018-05-29 MED ORDER — AZITHROMYCIN 250 MG PO TABS
1000.0000 mg | ORAL_TABLET | Freq: Once | ORAL | Status: AC
  Administered 2018-05-29: 1000 mg via ORAL
  Filled 2018-05-29: qty 4

## 2018-05-29 MED ORDER — HYDROXYZINE HCL 25 MG PO TABS: 25.0000 mg | ORAL_TABLET | Freq: Two times a day (BID) | ORAL | 0 refills | Status: AC | PRN

## 2018-05-29 MED ORDER — CEPHALEXIN 500 MG PO CAPS: 500.0000 mg | ORAL_CAPSULE | Freq: Two times a day (BID) | ORAL | 0 refills | Status: AC

## 2018-05-29 MED ORDER — CEFTRIAXONE SODIUM 250 MG IJ SOLR
250.0000 mg | Freq: Once | INTRAMUSCULAR | Status: AC
  Administered 2018-05-29: 250 mg via INTRAMUSCULAR
  Filled 2018-05-29: qty 250

## 2018-05-29 NOTE — ED Provider Notes (Signed)
[facility] EMERGENCY DEPARTMENT Provider Note   CSN: [id number] Arrival date & time: 05/28/18  2022    History   Chief Complaint Chief Complaint  Patient presents with  . Leg Swelling    HPI Hannah Nelson is a 28 y.o. female.     Patient presents to the emergency department with multiple complaints.  Patient reports that she has been experiencing pins-and-needles and pain in both of her feet and ankles.  The pain shoots up her legs when she walks.  She has not been walking extensively or doing any manual labor, longstanding or exercise.  Patient also complaining of chest pain.  Pain is across her upper chest intermittently.  No associated shortness of breath.     Past Medical History:  Diagnosis Date  . Chlamydia June 2013  . Gonorrhea   . Hematoma    following delivery, blood trans. X 4  . History of blood transfusion    postpartum  . Infection    BV;may get frequently;currently has BV and meds rx'd  . Infection    UTI;not frequently  . No pertinent past medical history   . Preeclampsia, severe     Patient Active Problem List   Diagnosis Date Noted  . Postpartum fever 07/25/2012  . Vaginal delivery 07/21/2012  . Vaginal hematoma 07/21/2012    Past Surgical History:  Procedure Laterality Date  . DILATION AND EVACUATION N/A 04/21/2013   Procedure: DILATATION AND EVACUATION;  Surgeon: Willodean Rosenthal, MD;  Location: [facility];  Service: Gynecology;  Laterality: N/A;  . NO PAST SURGERIES       OB History    Gravida  2   Para  1   Term  1   Preterm      AB      Living  1     SAB      TAB      Ectopic      Multiple      Live Births  1            Home Medications    Prior to Admission medications   Medication Sig Start Date End Date Taking? Authorizing Provider  aspirin 325 MG tablet Take 325 mg by mouth daily as needed for mild pain.   Yes [provider]  acetaminophen (TYLENOL) 500 MG tablet Take 1  tablet (500 mg total) by mouth every 6 (six) hours as needed. Patient not taking: Reported on 05/29/2018 05/24/18   Emi Holes, PA-C  cephALEXin (KEFLEX) 500 MG capsule Take 1 capsule (500 mg total) by mouth 2 (two) times daily. 05/29/18   Gilda Crease, MD  ferrous sulfate 325 (65 FE) MG tablet Take 1 tablet (325 mg total) by mouth daily with breakfast. Patient not taking: Reported on 09/22/2015 07/24/12   Haroldine Laws, CNM  hydrOXYzine (ATARAX/VISTARIL) 25 MG tablet Take 1 tablet (25 mg total) by mouth 2 (two) times daily as needed for anxiety. 05/29/18   Gilda Crease, MD  ibuprofen (ADVIL,MOTRIN) 600 MG tablet Take 1 tablet (600 mg total) by mouth every 6 (six) hours as needed. Patient not taking: Reported on 05/29/2018 05/24/18   Emi Holes, PA-C    Family History Family History  Problem Relation Age of Onset  . Heart disease Father        deceased;died before pt was born    Social History Social History   Tobacco Use  . Smoking status: Current Every Day Smoker  Packs/day: 2.00    Years: 6.00    Pack years: 12.00    Types: Cigarettes  . Smokeless tobacco: Never Used  Substance Use Topics  . Alcohol use: Yes    Comment: occasional   . Drug use: Yes    Frequency: 7.0 times per week     Allergies   Patient has no known allergies.   Review of Systems Review of Systems  Cardiovascular: Positive for chest pain.  Musculoskeletal: Positive for arthralgias and myalgias.  All other systems reviewed and are negative.    Physical Exam Updated Vital Signs BP (!) 90/53 (BP Location: Right Arm)   Pulse (!) 55   Temp 98.3 F (36.8 C) (Oral)   Resp 17   Ht 5\' 4"  (1.626 m)   Wt 58.9 kg   LMP 05/12/2018   SpO2 100%   BMI 22.29 kg/m   Physical Exam Vitals signs and nursing note reviewed.  Constitutional:      General: She is not in acute distress.    Appearance: Normal appearance. She is well-developed.  HENT:     Head: Normocephalic and  atraumatic.     Right Ear: Hearing normal.     Left Ear: Hearing normal.     Nose: Nose normal.  Eyes:     Conjunctiva/sclera: Conjunctivae normal.     Pupils: Pupils are equal, round, and reactive to light.  Neck:     Musculoskeletal: Normal range of motion and neck supple.  Cardiovascular:     Rate and Rhythm: Regular rhythm.     Heart sounds: S1 normal and S2 normal. No murmur. No friction rub. No gallop.   Pulmonary:     Effort: Pulmonary effort is normal. No respiratory distress.     Breath sounds: Normal breath sounds.  Chest:     Chest wall: No tenderness.  Abdominal:     General: Bowel sounds are normal.     Palpations: Abdomen is soft.     Tenderness: There is no abdominal tenderness. There is no guarding or rebound. Negative signs include Murphy's sign and McBurney's sign.     Hernia: No hernia is present.  Musculoskeletal: Normal range of motion.  Skin:    General: Skin is warm and dry.     Findings: No rash.  Neurological:     Mental Status: She is alert and oriented to person, place, and time.     GCS: GCS eye subscore is 4. GCS verbal subscore is 5. GCS motor subscore is 6.     Cranial Nerves: No cranial nerve deficit.     Sensory: No sensory deficit.     Coordination: Coordination normal.  Psychiatric:        Mood and Affect: Mood is anxious.        Speech: Speech normal.        Behavior: Behavior normal.        Thought Content: Thought content normal.      ED Treatments / Results  Labs (all labs ordered are listed, but only abnormal results are displayed) Labs Reviewed  BASIC METABOLIC PANEL - Abnormal; Notable for the following components:      Result Value   Chloride 112 (*)    Creatinine, Ser 1.38 (*)    Calcium 8.8 (*)    GFR calc non Af Amer 53 (*)    Anion gap 4 (*)    All other components within normal limits  CBC - Abnormal; Notable for the following components:   WBC  13.9 (*)    All other components within normal limits  URINALYSIS,  ROUTINE W REFLEX MICROSCOPIC - Abnormal; Notable for the following components:   Color, Urine AMBER (*)    APPearance CLOUDY (*)    Protein, ur 30 (*)    Leukocytes,Ua LARGE (*)    WBC, UA >50 (*)    Bacteria, UA RARE (*)    Squamous Epithelial / LPF >50 (*)    Trichomonas, UA PRESENT (*)    Non Squamous Epithelial 0-5 (*)    All other components within normal limits  I-STAT TROPONIN, ED  I-STAT BETA HCG BLOOD, ED ([facility], [facility], [facility] ONLY)    EKG EKG Interpretation  Date/Time:  Tuesday May 28 2018 20:45:26 EDT Ventricular Rate:  77 PR Interval:  128 QRS Duration: 84 QT Interval:  366 QTC Calculation: 414 R Axis:   75 Text Interpretation:  Normal sinus rhythm with sinus arrhythmia Normal ECG Confirmed by Gilda Crease [id number]) on 05/29/2018 3:57:47 AM   Radiology Dg Chest 2 View  Result Date: 05/28/2018 CLINICAL DATA:  Chest pain. EXAM: CHEST - 2 VIEW COMPARISON:  05/23/2018 FINDINGS: Cardiomediastinal silhouette is normal. Mediastinal contours appear intact. There is no evidence of focal airspace consolidation, pleural effusion or pneumothorax. Osseous structures are without acute abnormality. Soft tissues are grossly normal. IMPRESSION: No active cardiopulmonary disease. Electronically Signed   By: Ted Mcalpine M.D.   On: 05/28/2018 21:09    Procedures Procedures (including critical care time)  Medications Ordered in ED Medications  cefTRIAXone (ROCEPHIN) injection 250 mg (has no administration in time range)  azithromycin (ZITHROMAX) tablet 1,000 mg (has no administration in time range)  sodium chloride flush (NS) 0.9 % injection 3 mL (3 mLs Intravenous Given 05/29/18 0412)     Initial Impression / Assessment and Plan / ED Course  I have reviewed the triage vital signs and the nursing notes.  Pertinent labs & imaging results that were available during my care of the patient were reviewed by me and considered in my medical decision making (see chart for  details).        Patient presented stating that her legs were swollen over the last couple of days but they are not swollen now.  She has been noticing pain in her feet and ankles.  Examination reveals that she is somewhat anxious and hyperesthetic.  Visually, her feet, ankles, legs are normal.  No swelling, no redness, no signs of trauma or any other abnormality.  Simply lightly touching the area makes her jump, however.  This is very much out of proportion of what would be expected.  Reviewing her records reveal she has had multiple visits recently to this ER with somatic complaints.  She has had frequent complaints of chest pain but other areas of numbness, tingling, pain in her extremities.  They have not really followed a pattern.  I do not suspect serious healthcare issue with the patient.  Her work-up today, as well as previous ER visit work-ups, have been very reassuring.  The only normality seen on blood work is leukocytosis.  This was present on her previous visits as well.  She is afebrile, appears well, no suspicion for sepsis.  Her urinalysis, however, does suggest infection.  Will treat empirically for STD as well as UTI.  As far as her legs go, there is no swelling, no calf tenderness.  No risk factors for DVT and no findings on exam that would suggest DVT.  She has normal pulses and  sensation, no sign of central or peripheral nerve condition.  Final Clinical Impressions(s) / ED Diagnoses   Final diagnoses:  Paresthesia  Urinary tract infection without hematuria, site unspecified  Anxiety    ED Discharge Orders         Ordered    cephALEXin (KEFLEX) 500 MG capsule  2 times daily     05/29/18 0444    hydrOXYzine (ATARAX/VISTARIL) 25 MG tablet  2 times daily PRN     05/29/18 0447           Gilda Crease, MD 05/29/18 [phone]

## 2018-05-29 NOTE — ED Notes (Signed)
Pt called multiple times (4) with no answer in the lobby. Charge nurse notified.

## 2018-06-08 ENCOUNTER — Other Ambulatory Visit: Payer: Self-pay

## 2018-06-08 ENCOUNTER — Emergency Department (HOSPITAL_COMMUNITY)
Admission: EM | Admit: 2018-06-08 | Discharge: 2018-06-08 | Disposition: A | Discharge status: Home / Self Care | Payer: Self-pay | Attending: Emergency Medicine | Admitting: Emergency Medicine

## 2018-06-08 ENCOUNTER — Encounter (HOSPITAL_COMMUNITY): Payer: Self-pay | Admitting: Emergency Medicine

## 2018-06-08 ENCOUNTER — Emergency Department (HOSPITAL_COMMUNITY): Payer: Self-pay

## 2018-06-08 DIAGNOSIS — Z79899 Other long term (current) drug therapy: Secondary | ICD-10-CM | POA: Insufficient documentation

## 2018-06-08 DIAGNOSIS — F17210 Nicotine dependence, cigarettes, uncomplicated: Secondary | ICD-10-CM | POA: Insufficient documentation

## 2018-06-08 DIAGNOSIS — R079 Chest pain, unspecified: Secondary | ICD-10-CM | POA: Insufficient documentation

## 2018-06-08 LAB — BASIC METABOLIC PANEL
Anion gap: 7 (ref 5–15)
BUN: 8 mg/dL (ref 6–20)
CHLORIDE: 111 mmol/L (ref 98–111)
CO2: 24 mmol/L (ref 22–32)
Calcium: 8.6 mg/dL — ABNORMAL LOW (ref 8.9–10.3)
Creatinine, Ser: 1.06 mg/dL — ABNORMAL HIGH (ref 0.44–1.00)
GFR calc Af Amer: >60 mL/min (ref >60)
GFR calc non Af Amer: >60 mL/min (ref >60)
Glucose, Bld: 97 mg/dL (ref 70–99)
Potassium: 3.6 mmol/L (ref 3.5–5.1)
Sodium: 142 mmol/L (ref 135–145)

## 2018-06-08 LAB — CBC
HCT: 39.9 % (ref 36.0–46.0)
Hemoglobin: 13.3 g/dL (ref 12.0–15.0)
MCH: 30 pg (ref 26.0–34.0)
MCHC: 33.3 g/dL (ref 30.0–36.0)
MCV: 90.1 fL (ref 80.0–100.0)
Platelets: 274 10*3/uL (ref 150–400)
RBC: 4.43 MIL/uL (ref 3.87–5.11)
RDW: 14.5 % (ref 11.5–15.5)
WBC: 13.7 10*3/uL — ABNORMAL HIGH (ref 4.0–10.5)
nRBC: 0 % (ref 0.0–0.2)

## 2018-06-08 LAB — PROTIME-INR
INR: 1 (ref 0.8–1.2)
PROTHROMBIN TIME: 12.6 s (ref 11.4–15.2)

## 2018-06-08 LAB — I-STAT BETA HCG BLOOD, ED (MC, WL, AP ONLY): I-stat hCG, quantitative: <5 m[IU]/mL (ref <5)

## 2018-06-08 LAB — I-STAT TROPONIN, ED: Troponin i, poc: 0 ng/mL (ref 0.00–0.08)

## 2018-06-08 MED ORDER — LORAZEPAM 1 MG PO TABS: 1.0000 mg | ORAL_TABLET | Freq: Three times a day (TID) | ORAL | 0 refills | Status: AC | PRN

## 2018-06-08 MED ORDER — LORAZEPAM 1 MG PO TABS
1.0000 mg | ORAL_TABLET | Freq: Once | ORAL | Status: AC
  Administered 2018-06-08: 1 mg via ORAL
  Filled 2018-06-08: qty 1

## 2018-06-08 NOTE — Discharge Instructions (Signed)
Can take ativan as needed if feeling anxious. Follow-up with primary care doctor.  See list of clinics to help.   Return to the ED for new or worsening symptoms.

## 2018-06-08 NOTE — ED Triage Notes (Signed)
Patient reports intermittent left upper chest for 2 weeks with occasional productive cough /mild SOB , no nausea or diaphoresis ,pt. added headache and left leg pain - denies injury/ambulatory .

## 2018-06-08 NOTE — ED Provider Notes (Signed)
[facility] EMERGENCY DEPARTMENT Provider Note   CSN: [id number] Arrival date & time: 06/08/18  0051    History   Chief Complaint Chief Complaint  Patient presents with  . Chest Pain  . Leg Pain    Left    HPI Joycelin Mcnatt is a 28 y.o. female.     The history is provided by the patient and medical records.  Chest Pain  Leg Pain     28 year old female with no ongoing past medical history, presenting to the ED with multiple complaints.  Patient reports for the past 2 days she has been having intermittent left-sided chest pain, tingling in her left foot and up the leg, feeling of shortness of breath, and feeling worried.  This is her fourth ED visit for same within the past 30 days.  States she just feels like something is wrong and no one has diagnosed her.  She does report cough with green sputum but denies any fever, chills, sore throat, ear pain, or other upper respiratory symptoms.  She is not had any sick contacts.  She denies any swelling of the legs, recent travel, surgery, or trauma.  She has no history of DVT or PE.  She is not currently working as her job is closed due to coronavirus.  States she was told at 1 of her prior visits that she may be suffering from "panic attacks" but states "I do not know what that feels like".  She does report lots of changes in her life recently--she is had to move, has been some issues with significant other, as well as family issues.  States she does recognize that she is probably not adjusting to these very well.  She does appear she was prescribed some Vistaril at prior ED visit, however she never filled this.  Past Medical History:  Diagnosis Date  . Chlamydia June 2013  . Gonorrhea   . Hematoma    following delivery, blood trans. X 4  . History of blood transfusion    postpartum  . Infection    BV;may get frequently;currently has BV and meds rx'd  . Infection    UTI;not frequently  . No pertinent past medical  history   . Preeclampsia, severe     Patient Active Problem List   Diagnosis Date Noted  . Postpartum fever 07/25/2012  . Vaginal delivery 07/21/2012  . Vaginal hematoma 07/21/2012    Past Surgical History:  Procedure Laterality Date  . DILATION AND EVACUATION N/A 04/21/2013   Procedure: DILATATION AND EVACUATION;  Surgeon: Willodean Rosenthal, MD;  Location: [facility];  Service: Gynecology;  Laterality: N/A;  . NO PAST SURGERIES       OB History    Gravida  2   Para  1   Term  1   Preterm      AB      Living  1     SAB      TAB      Ectopic      Multiple      Live Births  1            Home Medications    Prior to Admission medications   Medication Sig Start Date End Date Taking? Authorizing Provider  acetaminophen (TYLENOL) 500 MG tablet Take 1 tablet (500 mg total) by mouth every 6 (six) hours as needed. Patient not taking: Reported on 05/29/2018 05/24/18   Emi Holes, PA-C  aspirin 325 MG tablet Take 325  mg by mouth daily as needed for mild pain.    [provider]  cephALEXin (KEFLEX) 500 MG capsule Take 1 capsule (500 mg total) by mouth 2 (two) times daily. 05/29/18   Gilda Crease, MD  ferrous sulfate 325 (65 FE) MG tablet Take 1 tablet (325 mg total) by mouth daily with breakfast. Patient not taking: Reported on 09/22/2015 07/24/12   Haroldine Laws, CNM  hydrOXYzine (ATARAX/VISTARIL) 25 MG tablet Take 1 tablet (25 mg total) by mouth 2 (two) times daily as needed for anxiety. 05/29/18   Gilda Crease, MD  ibuprofen (ADVIL,MOTRIN) 600 MG tablet Take 1 tablet (600 mg total) by mouth every 6 (six) hours as needed. Patient not taking: Reported on 05/29/2018 05/24/18   Emi Holes, PA-C    Family History Family History  Problem Relation Age of Onset  . Heart disease Father        deceased;died before pt was born    Social History Social History   Tobacco Use  . Smoking status: Current Every Day Smoker    Packs/day:  2.00    Years: 6.00    Pack years: 12.00    Types: Cigarettes  . Smokeless tobacco: Never Used  Substance Use Topics  . Alcohol use: Yes    Comment: occasional   . Drug use: Yes    Frequency: 7.0 times per week     Allergies   Patient has no known allergies.   Review of Systems Review of Systems  Cardiovascular: Positive for chest pain.  All other systems reviewed and are negative.    Physical Exam Updated Vital Signs BP 114/77 (BP Location: Right Arm)   Pulse 80   Temp 98.4 F (36.9 C) (Oral)   Resp 12   Ht 5\' 5"  (1.651 m)   Wt 59.4 kg   LMP 06/06/2018   SpO2 99%   BMI 21.80 kg/m   Physical Exam Vitals signs and nursing note reviewed.  Constitutional:      Appearance: She is well-developed.  HENT:     Head: Normocephalic and atraumatic.  Eyes:     Conjunctiva/sclera: Conjunctivae normal.     Pupils: Pupils are equal, round, and reactive to light.  Neck:     Musculoskeletal: Normal range of motion.  Cardiovascular:     Rate and Rhythm: Normal rate and regular rhythm.     Heart sounds: Normal heart sounds.  Pulmonary:     Effort: Pulmonary effort is normal.     Breath sounds: Normal breath sounds. No decreased breath sounds, wheezing or rhonchi.     Comments: Lungs clear, no wheezes or rhonchi Abdominal:     General: Bowel sounds are normal.     Palpations: Abdomen is soft.  Musculoskeletal: Normal range of motion.     Comments: Legs are atraumatic in appearance, no appreciable calf swelling, asymmetry, tenderness, or palpable cords, negative Homans sign bilaterally  Skin:    General: Skin is warm and dry.  Neurological:     Mental Status: She is alert and oriented to person, place, and time.  Psychiatric:        Mood and Affect: Mood is anxious.     Comments: Does appear anxious during exam, speaking very rapidly and becoming tearful when talking about life situation      ED Treatments / Results  Labs (all labs ordered are listed, but only  abnormal results are displayed) Labs Reviewed  BASIC METABOLIC PANEL - Abnormal; Notable for the following components:  Result Value   Creatinine, Ser 1.06 (*)    Calcium 8.6 (*)    All other components within normal limits  CBC - Abnormal; Notable for the following components:   WBC 13.7 (*)    All other components within normal limits  PROTIME-INR  I-STAT TROPONIN, ED  I-STAT BETA HCG BLOOD, ED ([facility], [facility], [facility] ONLY)    EKG None  Radiology Dg Chest 2 View  Result Date: 06/08/2018 CLINICAL DATA:  Central and left-sided chest pain with cough. EXAM: CHEST - 2 VIEW COMPARISON:  05/28/2018 FINDINGS: Cardiomediastinal silhouette is normal. Mediastinal contours appear intact. There is no evidence of focal airspace consolidation, pleural effusion or pneumothorax. Osseous structures are without acute abnormality. Soft tissues are grossly normal. IMPRESSION: No active cardiopulmonary disease. Electronically Signed   By: Ted Mcalpine M.D.   On: 06/08/2018 01:47    Procedures Procedures (including critical care time)  Medications Ordered in ED Medications  LORazepam (ATIVAN) tablet 1 mg (has no administration in time range)     Initial Impression / Assessment and Plan / ED Course  I have reviewed the triage vital signs and the nursing notes.  Pertinent labs & imaging results that were available during my care of the patient were reviewed by me and considered in my medical decision making (see chart for details).  28 y.o. F here with multiple complaints.  She reported intermittent chest pain, tingling in her feet up her legs, headache, and intermittent SOB.  This is her 4th ED visit in the past month for very similar complaints.  States she was told before it may be "panic attacks" but she is unsure because "I don't know what that feels like".  She does appear anxious on exam here, she does admit to some recent stressors at home and feeling worried a lot.  EKG here is nonischemic.   Her labs are reassuring--does have leukocytosis, however this is trending downward from prior values.  Troponin is negative.  Chest x-ray is clear.  She has no tachycardia or hypoxia to suggest acute PE and no risk factors for such.  I have low suspicion for ACS, PE, dissection, acute cardiac event.  Feel her symptoms are largely in part due to anxiety.  She was given Ativan here with improvement, states she feels much more calm at this time.  Discharged home with prescription for same, advised to take only as needed.  She can follow-up with PCP.  Return here for any new or acute changes.  Final Clinical Impressions(s) / ED Diagnoses   Final diagnoses:  Chest pain in adult    ED Discharge Orders         Ordered    LORazepam (ATIVAN) 1 MG tablet  3 times daily PRN     06/08/18 0337           Garlon Hatchet, PA-C 06/08/18 0506    Mesner, Barbara Cower, MD 06/08/18 [phone]

## 2018-07-07 NOTE — Congregational Nurse Program (Signed)
Patient stated she would like to get help signing up for medicaid.

## 2018-07-11 ENCOUNTER — Telehealth: Payer: Self-pay | Admitting: Pediatric Intensive Care

## 2018-07-11 NOTE — Telephone Encounter (Signed)
Left HIPAA complaint message for client to return case management referral call. Shann Medal RN BSN CNP [phone]

## 2018-07-12 NOTE — Progress Notes (Signed)
COVID Hotel Screening performed. Temperature, PHQ-9, and need for medical care and medications assessed. No other needs assessed.  Carlyle Basques RN MSN

## 2018-07-15 ENCOUNTER — Telehealth: Payer: Self-pay | Admitting: Pediatric Intensive Care

## 2018-07-15 NOTE — Telephone Encounter (Signed)
Left HIPAA compliant message for client to return call. Victoria Hussey RN BSN CNP [phone] 

## 2018-07-22 NOTE — Congregational Nurse Program (Signed)
Closing encounter per request. 

## 2019-02-10 ENCOUNTER — Emergency Department (HOSPITAL_COMMUNITY)
Admission: EM | Admit: 2019-02-10 | Discharge: 2019-02-10 | Disposition: A | Discharge status: Home / Self Care | Payer: No Typology Code available for payment source | Attending: Emergency Medicine | Admitting: Emergency Medicine

## 2019-02-10 ENCOUNTER — Other Ambulatory Visit: Payer: Self-pay

## 2019-02-10 ENCOUNTER — Emergency Department (HOSPITAL_COMMUNITY): Payer: No Typology Code available for payment source

## 2019-02-10 ENCOUNTER — Encounter (HOSPITAL_COMMUNITY): Payer: Self-pay | Admitting: Emergency Medicine

## 2019-02-10 DIAGNOSIS — M545 Low back pain: Secondary | ICD-10-CM | POA: Diagnosis present

## 2019-02-10 DIAGNOSIS — R102 Pelvic and perineal pain: Secondary | ICD-10-CM | POA: Diagnosis not present

## 2019-02-10 DIAGNOSIS — M7918 Myalgia, other site: Secondary | ICD-10-CM | POA: Diagnosis not present

## 2019-02-10 LAB — CBC WITH DIFFERENTIAL/PLATELET
Abs Immature Granulocytes: 0.13 10*3/uL — ABNORMAL HIGH (ref 0.00–0.07)
Basophils Absolute: 0.1 10*3/uL (ref 0.0–0.1)
Basophils Relative: 1 %
Eosinophils Absolute: 0.4 10*3/uL (ref 0.0–0.5)
Eosinophils Relative: 4 %
HCT: 41.5 % (ref 36.0–46.0)
Hemoglobin: 13.9 g/dL (ref 12.0–15.0)
Immature Granulocytes: 1 %
Lymphocytes Relative: 32 %
Lymphs Abs: 3.9 10*3/uL (ref 0.7–4.0)
MCH: 31.7 pg (ref 26.0–34.0)
MCHC: 33.5 g/dL (ref 30.0–36.0)
MCV: 94.5 fL (ref 80.0–100.0)
Monocytes Absolute: 0.8 10*3/uL (ref 0.1–1.0)
Monocytes Relative: 6 %
Neutro Abs: 6.8 10*3/uL (ref 1.7–7.7)
Neutrophils Relative %: 56 %
Platelets: 278 10*3/uL (ref 150–400)
RBC: 4.39 MIL/uL (ref 3.87–5.11)
RDW: 13.2 % (ref 11.5–15.5)
WBC: 12.1 10*3/uL — ABNORMAL HIGH (ref 4.0–10.5)
nRBC: 0 % (ref 0.0–0.2)

## 2019-02-10 LAB — I-STAT BETA HCG BLOOD, ED (MC, WL, AP ONLY): I-stat hCG, quantitative: <5 m[IU]/mL (ref <5)

## 2019-02-10 LAB — SAMPLE TO BLOOD BANK

## 2019-02-10 LAB — BASIC METABOLIC PANEL
Anion gap: 6 (ref 5–15)
BUN: 17 mg/dL (ref 6–20)
CO2: 25 mmol/L (ref 22–32)
Calcium: 9.1 mg/dL (ref 8.9–10.3)
Chloride: 105 mmol/L (ref 98–111)
Creatinine, Ser: 1.22 mg/dL — ABNORMAL HIGH (ref 0.44–1.00)
GFR calc Af Amer: >60 mL/min (ref >60)
GFR calc non Af Amer: >60 mL/min (ref >60)
Glucose, Bld: 85 mg/dL (ref 70–99)
Potassium: 4 mmol/L (ref 3.5–5.1)
Sodium: 136 mmol/L (ref 135–145)

## 2019-02-10 MED ORDER — ACETAMINOPHEN 500 MG PO TABS
1000.0000 mg | ORAL_TABLET | Freq: Once | ORAL | Status: AC
  Administered 2019-02-10: 1000 mg via ORAL
  Filled 2019-02-10: qty 2

## 2019-02-10 MED ORDER — FENTANYL CITRATE (PF) 100 MCG/2ML IJ SOLN
50.0000 ug | Freq: Once | INTRAMUSCULAR | Status: AC
  Administered 2019-02-10: 50 ug via INTRAVENOUS
  Filled 2019-02-10: qty 2

## 2019-02-10 MED ORDER — IOHEXOL 300 MG/ML  SOLN
100.0000 mL | Freq: Once | INTRAMUSCULAR | Status: AC | PRN
  Administered 2019-02-10: 100 mL via INTRAVENOUS

## 2019-02-10 MED ORDER — IBUPROFEN 400 MG PO TABS: 400.0000 mg | ORAL_TABLET | Freq: Four times a day (QID) | ORAL | 0 refills | Status: AC | PRN

## 2019-02-10 MED ORDER — CYCLOBENZAPRINE HCL 10 MG PO TABS: 10.0000 mg | ORAL_TABLET | Freq: Two times a day (BID) | ORAL | 0 refills | Status: AC | PRN

## 2019-02-10 NOTE — Progress Notes (Signed)
Orthopedic Tech Progress Note Patient Details:  Jonna Dittrich Jul 01, 1990 [id number]  Patient ID: Wynelle Beckmann, female   DOB: 10-06-1990, 28 y.o.   MRN: [id number]   Sandra Cockayne 2 Trauma 02/10/2019, 2:44 PM
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
## 2019-02-10 NOTE — ED Provider Notes (Signed)
2:58 PM Assumed care from Dr. Alvino Chapel, please see their note for full history, physical and decision making until this point. In brief this is a 28 y.o. year old female who presented to the ED tonight with No chief complaint on file.     Fell, pelvis pain. Getting ct. Likely discharge.  Ct ok.   Ambulates.   Pain controlled. Dc on supportive care.   Discharge instructions, including strict return precautions for new or worsening symptoms, given. Patient and/or family verbalized understanding and agreement with the plan as described.   Labs, studies and imaging reviewed by myself and considered in medical decision making if ordered. Imaging interpreted by radiology.  Labs Reviewed  BASIC METABOLIC PANEL  CBC WITH DIFFERENTIAL/PLATELET  I-STAT BETA HCG BLOOD, ED ([facility], [facility], [facility] ONLY)  SAMPLE TO BLOOD BANK    DG Pelvis Portable  Final Result    CT ABDOMEN PELVIS W CONTRAST    (Results Pending)    No follow-ups on file.    Merrily Pew, MD 02/10/19 2220

## 2019-02-10 NOTE — ED Notes (Signed)
Patient transported to CT 
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
## 2019-03-27 ENCOUNTER — Other Ambulatory Visit: Payer: Self-pay

## 2019-03-27 ENCOUNTER — Ambulatory Visit (HOSPITAL_COMMUNITY)
Admission: EM | Admit: 2019-03-27 | Discharge: 2019-03-27 | Disposition: A | Discharge status: Home / Self Care | Payer: Medicaid Other

## 2019-03-27 NOTE — ED Notes (Signed)
Hannah Nelson, patient access reports patient is not answering for registration
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
